# Patient Record
Sex: Female | Born: 1940 | Race: Black or African American | Hispanic: No | Smoking: Never smoker
Health system: Southern US, Community
[De-identification: ages and names within clinical notes are randomized; demographics above are authoritative.]

## PROBLEM LIST (undated history)

## (undated) DIAGNOSIS — C50919 Malignant neoplasm of unspecified site of unspecified female breast: Secondary | ICD-10-CM

## (undated) DIAGNOSIS — Z923 Personal history of irradiation: Secondary | ICD-10-CM

---

## 1997-10-26 ENCOUNTER — Other Ambulatory Visit: Admission: RE | Admit: 1997-10-26 | Discharge: 1997-10-26 | Payer: Self-pay | Admitting: *Deleted

## 1997-12-28 ENCOUNTER — Encounter: Payer: Self-pay | Admitting: Internal Medicine

## 1997-12-28 ENCOUNTER — Emergency Department (HOSPITAL_COMMUNITY): Admission: EM | Admit: 1997-12-28 | Discharge: 1997-12-28 | Payer: Self-pay | Admitting: Emergency Medicine

## 1998-02-06 ENCOUNTER — Emergency Department (HOSPITAL_COMMUNITY): Admission: EM | Admit: 1998-02-06 | Discharge: 1998-02-07 | Payer: Self-pay | Admitting: Emergency Medicine

## 1998-02-10 ENCOUNTER — Ambulatory Visit (HOSPITAL_COMMUNITY): Admission: RE | Admit: 1998-02-10 | Discharge: 1998-02-10 | Payer: Self-pay | Admitting: Internal Medicine

## 1998-02-10 ENCOUNTER — Encounter: Payer: Self-pay | Admitting: Internal Medicine

## 1998-11-29 ENCOUNTER — Other Ambulatory Visit: Admission: RE | Admit: 1998-11-29 | Discharge: 1998-11-29 | Payer: Self-pay | Admitting: *Deleted

## 1999-12-12 ENCOUNTER — Other Ambulatory Visit: Admission: RE | Admit: 1999-12-12 | Discharge: 1999-12-12 | Payer: Self-pay | Admitting: *Deleted

## 1999-12-13 ENCOUNTER — Ambulatory Visit (HOSPITAL_COMMUNITY): Admission: RE | Admit: 1999-12-13 | Discharge: 1999-12-13 | Payer: Self-pay | Admitting: *Deleted

## 1999-12-13 ENCOUNTER — Encounter: Payer: Self-pay | Admitting: *Deleted

## 2000-01-09 ENCOUNTER — Encounter: Admission: RE | Admit: 2000-01-09 | Discharge: 2000-01-09 | Payer: Self-pay | Admitting: *Deleted

## 2000-01-16 ENCOUNTER — Other Ambulatory Visit: Admission: RE | Admit: 2000-01-16 | Discharge: 2000-01-16 | Payer: Self-pay | Admitting: *Deleted

## 2000-01-16 ENCOUNTER — Encounter (INDEPENDENT_AMBULATORY_CARE_PROVIDER_SITE_OTHER): Payer: Self-pay | Admitting: *Deleted

## 2000-01-16 ENCOUNTER — Encounter: Payer: Self-pay | Admitting: *Deleted

## 2000-01-16 ENCOUNTER — Encounter: Admission: RE | Admit: 2000-01-16 | Discharge: 2000-01-16 | Payer: Self-pay | Admitting: *Deleted

## 2000-01-18 ENCOUNTER — Encounter: Admission: RE | Admit: 2000-01-18 | Discharge: 2000-04-17 | Payer: Self-pay | Admitting: Radiation Oncology

## 2000-02-02 DIAGNOSIS — C50919 Malignant neoplasm of unspecified site of unspecified female breast: Secondary | ICD-10-CM

## 2000-02-02 HISTORY — PX: BREAST LUMPECTOMY: SHX2

## 2000-02-02 HISTORY — DX: Malignant neoplasm of unspecified site of unspecified female breast: C50.919

## 2000-02-15 ENCOUNTER — Encounter (INDEPENDENT_AMBULATORY_CARE_PROVIDER_SITE_OTHER): Payer: Self-pay | Admitting: *Deleted

## 2000-02-15 ENCOUNTER — Encounter: Payer: Self-pay | Admitting: Surgery

## 2000-02-15 ENCOUNTER — Ambulatory Visit (HOSPITAL_BASED_OUTPATIENT_CLINIC_OR_DEPARTMENT_OTHER): Admission: RE | Admit: 2000-02-15 | Discharge: 2000-02-15 | Payer: Self-pay | Admitting: Surgery

## 2000-03-07 ENCOUNTER — Encounter (INDEPENDENT_AMBULATORY_CARE_PROVIDER_SITE_OTHER): Payer: Self-pay | Admitting: Specialist

## 2000-03-07 ENCOUNTER — Encounter: Payer: Self-pay | Admitting: Surgery

## 2000-03-07 ENCOUNTER — Ambulatory Visit (HOSPITAL_COMMUNITY): Admission: RE | Admit: 2000-03-07 | Discharge: 2000-03-07 | Payer: Self-pay | Admitting: Surgery

## 2000-04-18 ENCOUNTER — Encounter: Admission: RE | Admit: 2000-04-18 | Discharge: 2000-07-17 | Payer: Self-pay | Admitting: Radiation Oncology

## 2000-09-13 ENCOUNTER — Encounter: Payer: Self-pay | Admitting: Oncology

## 2000-09-13 ENCOUNTER — Encounter: Admission: RE | Admit: 2000-09-13 | Discharge: 2000-09-13 | Payer: Self-pay | Admitting: Oncology

## 2000-09-17 ENCOUNTER — Encounter: Admission: RE | Admit: 2000-09-17 | Discharge: 2000-10-08 | Payer: Self-pay | Admitting: Oncology

## 2000-12-17 ENCOUNTER — Encounter: Payer: Self-pay | Admitting: Oncology

## 2000-12-17 ENCOUNTER — Ambulatory Visit (HOSPITAL_COMMUNITY): Admission: RE | Admit: 2000-12-17 | Discharge: 2000-12-17 | Payer: Self-pay | Admitting: Oncology

## 2002-03-20 ENCOUNTER — Encounter: Payer: Self-pay | Admitting: Oncology

## 2002-03-20 ENCOUNTER — Encounter: Admission: RE | Admit: 2002-03-20 | Discharge: 2002-03-20 | Payer: Self-pay | Admitting: Oncology

## 2002-04-24 ENCOUNTER — Other Ambulatory Visit: Admission: RE | Admit: 2002-04-24 | Discharge: 2002-04-24 | Payer: Self-pay | Admitting: Obstetrics and Gynecology

## 2003-03-22 ENCOUNTER — Encounter: Admission: RE | Admit: 2003-03-22 | Discharge: 2003-03-22 | Payer: Self-pay | Admitting: Oncology

## 2003-08-13 ENCOUNTER — Ambulatory Visit (HOSPITAL_COMMUNITY): Admission: RE | Admit: 2003-08-13 | Discharge: 2003-08-13 | Payer: Self-pay | Admitting: Gastroenterology

## 2004-02-08 ENCOUNTER — Encounter: Admission: RE | Admit: 2004-02-08 | Discharge: 2004-02-08 | Payer: Self-pay | Admitting: Internal Medicine

## 2004-03-22 ENCOUNTER — Encounter: Admission: RE | Admit: 2004-03-22 | Discharge: 2004-03-22 | Payer: Self-pay | Admitting: Oncology

## 2004-03-28 ENCOUNTER — Ambulatory Visit: Payer: Self-pay | Admitting: Oncology

## 2004-10-03 ENCOUNTER — Ambulatory Visit: Payer: Self-pay | Admitting: Oncology

## 2004-12-05 ENCOUNTER — Encounter: Admission: RE | Admit: 2004-12-05 | Discharge: 2004-12-05 | Payer: Self-pay | Admitting: Internal Medicine

## 2004-12-05 ENCOUNTER — Ambulatory Visit: Payer: Self-pay | Admitting: Oncology

## 2005-03-28 ENCOUNTER — Encounter: Admission: RE | Admit: 2005-03-28 | Discharge: 2005-03-28 | Payer: Self-pay | Admitting: Oncology

## 2005-04-17 ENCOUNTER — Ambulatory Visit: Payer: Self-pay | Admitting: Oncology

## 2005-04-18 LAB — CBC WITH DIFFERENTIAL/PLATELET
Basophils Absolute: 0 10*3/uL (ref 0.0–0.1)
Eosinophils Absolute: 0.2 10*3/uL (ref 0.0–0.5)
HGB: 12.4 g/dL (ref 11.6–15.9)
MONO#: 0.5 10*3/uL (ref 0.1–0.9)
NEUT#: 4.5 10*3/uL (ref 1.5–6.5)
RBC: 4.15 10*6/uL (ref 3.70–5.32)
RDW: 12.9 % (ref 11.3–14.5)
WBC: 7 10*3/uL (ref 3.9–10.0)

## 2005-04-18 LAB — COMPREHENSIVE METABOLIC PANEL
Albumin: 4 g/dL (ref 3.5–5.2)
CO2: 28 mEq/L (ref 19–32)
Calcium: 8.8 mg/dL (ref 8.4–10.5)
Chloride: 104 mEq/L (ref 96–112)
Glucose, Bld: 102 mg/dL — ABNORMAL HIGH (ref 70–99)
Potassium: 3.8 mEq/L (ref 3.5–5.3)
Sodium: 142 mEq/L (ref 135–145)
Total Protein: 7.7 g/dL (ref 6.0–8.3)

## 2005-06-21 ENCOUNTER — Ambulatory Visit: Payer: Self-pay | Admitting: Oncology

## 2006-03-26 ENCOUNTER — Emergency Department (HOSPITAL_COMMUNITY): Admission: EM | Admit: 2006-03-26 | Discharge: 2006-03-26 | Payer: Self-pay | Admitting: *Deleted

## 2007-10-07 ENCOUNTER — Encounter: Admission: RE | Admit: 2007-10-07 | Discharge: 2007-10-07 | Payer: Self-pay | Admitting: Oncology

## 2007-11-19 ENCOUNTER — Ambulatory Visit: Payer: Self-pay | Admitting: Oncology

## 2007-11-21 LAB — CBC WITH DIFFERENTIAL/PLATELET
Basophils Absolute: 0 10*3/uL (ref 0.0–0.1)
Eosinophils Absolute: 0.1 10*3/uL (ref 0.0–0.5)
HCT: 38 % (ref 34.8–46.6)
HGB: 13.1 g/dL (ref 11.6–15.9)
LYMPH%: 20.9 % (ref 14.0–48.0)
MCV: 87 fL (ref 81.0–101.0)
MONO#: 0.6 10*3/uL (ref 0.1–0.9)
MONO%: 8.4 % (ref 0.0–13.0)
NEUT#: 5.3 10*3/uL (ref 1.5–6.5)
Platelets: 262 10*3/uL (ref 145–400)
RBC: 4.36 10*6/uL (ref 3.70–5.32)
WBC: 7.8 10*3/uL (ref 3.9–10.0)

## 2007-11-21 LAB — COMPREHENSIVE METABOLIC PANEL
Albumin: 4.1 g/dL (ref 3.5–5.2)
BUN: 16 mg/dL (ref 6–23)
CO2: 26 mEq/L (ref 19–32)
Glucose, Bld: 108 mg/dL — ABNORMAL HIGH (ref 70–99)
Sodium: 141 mEq/L (ref 135–145)
Total Bilirubin: 0.2 mg/dL — ABNORMAL LOW (ref 0.3–1.2)
Total Protein: 7.8 g/dL (ref 6.0–8.3)

## 2008-11-17 ENCOUNTER — Ambulatory Visit: Payer: Self-pay | Admitting: Oncology

## 2009-01-14 ENCOUNTER — Encounter: Admission: RE | Admit: 2009-01-14 | Discharge: 2009-01-14 | Payer: Self-pay | Admitting: Oncology

## 2010-01-22 ENCOUNTER — Encounter: Payer: Self-pay | Admitting: Oncology

## 2010-01-22 ENCOUNTER — Encounter: Payer: Self-pay | Admitting: Internal Medicine

## 2010-05-19 NOTE — Op Note (Signed)
NAME:  Whitney Miller, Whitney Miller                     ACCOUNT NO.:  [id number]   MEDICAL RECORD NO.:  [id number]                   PATIENT TYPE:  AMB   LOCATION:  ENDO                                 FACILITY:  [facility]   PHYSICIAN:  Anselmo Rod, M.D.               DATE OF BIRTH:  Jun 08, 1940   DATE OF PROCEDURE:  08/26/2003  DATE OF DISCHARGE:  08/13/2003                                 OPERATIVE REPORT   PROCEDURE PERFORMED:  Colonoscopy up to the hepatic flexure.   ENDOSCOPIST:  Charna Elizabeth, M.D.   INSTRUMENT USED:  Olympus video colonoscope.   INDICATIONS FOR PROCEDURE:  The patient is a 70 year old female with  personal history of breast cancer in the past undergoing screening  colonoscopy to rule out colonic polyps, masses, etc.   PREPROCEDURE PREPARATION:  Informed consent was procured from the patient.  The patient was fasted for eight hours prior to the procedure and prepped  with a bottle of magnesium citrate and a gallon of GoLYTELY the night prior  to the procedure.   PREPROCEDURE PHYSICAL:  The patient had stable vital signs.  Neck supple.  Chest clear to auscultation.  S1 and S2 regular.  Abdomen soft with normal  bowel sounds.   DESCRIPTION OF PROCEDURE:  The patient was placed in left lateral decubitus  position and sedated with 60 mg of Demerol and 7.5 mg of Versed in slow  incremental doses.  Once the patient was adequately sedated and maintained  on low flow oxygen and continuous cardiac monitoring, the Olympus video  colonoscope was advanced from the rectum to the hepatic flexure with extreme  difficulty.  The patient had a very redundant colon.  The scope was  withdrawn several times and the patient repositioned from the left lateral  to the supine and the right lateral position with gentle application of  abdominal pressure.  A small patch of erythema was noticed in the midleft  colon.  This did not see to be typical of an arteriovenous malformation.  No  biopsies were done.  No masses or polyps were identified.  There was no  evidence of diverticulosis.  The cecum and the right colon could not be  visualized because of a poor prep and a very redundant colon.  The procedure  was aborted at the hepatic flexure with plans for a barium enema in the  future.   IMPRESSION:  1. Incomplete colonoscopy up to the distal right colon.  Very redundant     colon with some residual stool in the colon.  2. Small patch of erythema in the midleft colon, not typical of an     arteriovenous malformation, no biopsies done.   RECOMMENDATIONS:  Air contrast barium enema will be planned and further  recommendations made in follow-up.  Anselmo Rod, M.D.    JNM/MEDQ  D:  08/26/2003  T:  08/26/2003  Job:  [id number]   cc:   Larina Earthly, M.D.  [address]  [place]  [place] [address]  Fax: [phone]   Sandria Bales. Ezzard Standing, M.D.  [address], [address]  [place]  [place] [address]  Fax: [phone]   Lennis P. Darrold Span, M.D.  [address] [facility]  [place]  [place] [address]  Fax: [phone]

## 2010-05-26 ENCOUNTER — Other Ambulatory Visit: Payer: Self-pay | Admitting: Oncology

## 2010-05-26 DIAGNOSIS — Z1231 Encounter for screening mammogram for malignant neoplasm of breast: Secondary | ICD-10-CM

## 2010-05-30 ENCOUNTER — Ambulatory Visit
Admission: RE | Admit: 2010-05-30 | Discharge: 2010-05-30 | Disposition: A | Discharge status: Home / Self Care | Payer: Medicare Other | Source: Ambulatory Visit | Attending: Oncology | Admitting: Oncology

## 2010-05-30 DIAGNOSIS — Z1231 Encounter for screening mammogram for malignant neoplasm of breast: Secondary | ICD-10-CM

## 2011-02-16 DIAGNOSIS — I10 Essential (primary) hypertension: Secondary | ICD-10-CM | POA: Diagnosis not present

## 2011-02-16 DIAGNOSIS — R82998 Other abnormal findings in urine: Secondary | ICD-10-CM | POA: Diagnosis not present

## 2011-02-16 DIAGNOSIS — E785 Hyperlipidemia, unspecified: Secondary | ICD-10-CM | POA: Diagnosis not present

## 2011-02-19 DIAGNOSIS — Z0000 Encounter for general adult medical examination without abnormal findings: Secondary | ICD-10-CM | POA: Diagnosis not present

## 2011-02-19 DIAGNOSIS — I10 Essential (primary) hypertension: Secondary | ICD-10-CM | POA: Diagnosis not present

## 2011-02-19 DIAGNOSIS — M79609 Pain in unspecified limb: Secondary | ICD-10-CM | POA: Diagnosis not present

## 2011-02-19 DIAGNOSIS — R82998 Other abnormal findings in urine: Secondary | ICD-10-CM | POA: Diagnosis not present

## 2011-02-21 DIAGNOSIS — Z1212 Encounter for screening for malignant neoplasm of rectum: Secondary | ICD-10-CM | POA: Diagnosis not present

## 2011-03-14 ENCOUNTER — Other Ambulatory Visit: Payer: Self-pay | Admitting: Gastroenterology

## 2011-03-14 DIAGNOSIS — Z1211 Encounter for screening for malignant neoplasm of colon: Secondary | ICD-10-CM

## 2011-03-14 DIAGNOSIS — Q438 Other specified congenital malformations of intestine: Secondary | ICD-10-CM

## 2012-07-17 ENCOUNTER — Other Ambulatory Visit: Payer: Self-pay

## 2012-07-17 DIAGNOSIS — Z853 Personal history of malignant neoplasm of breast: Secondary | ICD-10-CM

## 2012-07-17 DIAGNOSIS — Z1231 Encounter for screening mammogram for malignant neoplasm of breast: Secondary | ICD-10-CM

## 2012-07-17 DIAGNOSIS — Z9889 Other specified postprocedural states: Secondary | ICD-10-CM

## 2012-08-07 ENCOUNTER — Ambulatory Visit
Admission: RE | Admit: 2012-08-07 | Discharge: 2012-08-07 | Disposition: A | Discharge status: Home / Self Care | Payer: Medicare Other | Source: Ambulatory Visit

## 2012-08-07 DIAGNOSIS — Z9889 Other specified postprocedural states: Secondary | ICD-10-CM

## 2012-08-07 DIAGNOSIS — Z1231 Encounter for screening mammogram for malignant neoplasm of breast: Secondary | ICD-10-CM | POA: Diagnosis not present

## 2012-08-07 DIAGNOSIS — Z853 Personal history of malignant neoplasm of breast: Secondary | ICD-10-CM

## 2012-10-03 DIAGNOSIS — C50919 Malignant neoplasm of unspecified site of unspecified female breast: Secondary | ICD-10-CM | POA: Diagnosis not present

## 2012-10-03 DIAGNOSIS — I10 Essential (primary) hypertension: Secondary | ICD-10-CM | POA: Diagnosis not present

## 2012-10-03 DIAGNOSIS — Z6829 Body mass index (BMI) 29.0-29.9, adult: Secondary | ICD-10-CM | POA: Diagnosis not present

## 2012-10-03 DIAGNOSIS — F39 Unspecified mood [affective] disorder: Secondary | ICD-10-CM | POA: Diagnosis not present

## 2012-10-03 DIAGNOSIS — E785 Hyperlipidemia, unspecified: Secondary | ICD-10-CM | POA: Diagnosis not present

## 2012-10-03 DIAGNOSIS — M79609 Pain in unspecified limb: Secondary | ICD-10-CM | POA: Diagnosis not present

## 2013-09-30 ENCOUNTER — Other Ambulatory Visit: Payer: Self-pay

## 2013-09-30 DIAGNOSIS — Z1231 Encounter for screening mammogram for malignant neoplasm of breast: Secondary | ICD-10-CM

## 2013-10-16 ENCOUNTER — Ambulatory Visit
Admission: RE | Admit: 2013-10-16 | Discharge: 2013-10-16 | Disposition: A | Discharge status: Home / Self Care | Payer: Medicare Other | Source: Ambulatory Visit

## 2013-10-16 DIAGNOSIS — Z1231 Encounter for screening mammogram for malignant neoplasm of breast: Secondary | ICD-10-CM | POA: Diagnosis not present

## 2013-11-03 DIAGNOSIS — Z23 Encounter for immunization: Secondary | ICD-10-CM | POA: Diagnosis not present

## 2013-11-03 DIAGNOSIS — J302 Other seasonal allergic rhinitis: Secondary | ICD-10-CM | POA: Diagnosis not present

## 2013-11-03 DIAGNOSIS — Z1389 Encounter for screening for other disorder: Secondary | ICD-10-CM | POA: Diagnosis not present

## 2013-11-03 DIAGNOSIS — K219 Gastro-esophageal reflux disease without esophagitis: Secondary | ICD-10-CM | POA: Diagnosis not present

## 2013-11-03 DIAGNOSIS — I10 Essential (primary) hypertension: Secondary | ICD-10-CM | POA: Diagnosis not present

## 2013-11-03 DIAGNOSIS — F39 Unspecified mood [affective] disorder: Secondary | ICD-10-CM | POA: Diagnosis not present

## 2013-11-03 DIAGNOSIS — E785 Hyperlipidemia, unspecified: Secondary | ICD-10-CM | POA: Diagnosis not present

## 2013-11-03 DIAGNOSIS — C50919 Malignant neoplasm of unspecified site of unspecified female breast: Secondary | ICD-10-CM | POA: Diagnosis not present

## 2014-01-04 DIAGNOSIS — E785 Hyperlipidemia, unspecified: Secondary | ICD-10-CM | POA: Diagnosis not present

## 2014-01-04 DIAGNOSIS — F39 Unspecified mood [affective] disorder: Secondary | ICD-10-CM | POA: Diagnosis not present

## 2014-01-04 DIAGNOSIS — Z6830 Body mass index (BMI) 30.0-30.9, adult: Secondary | ICD-10-CM | POA: Diagnosis not present

## 2014-01-04 DIAGNOSIS — I10 Essential (primary) hypertension: Secondary | ICD-10-CM | POA: Diagnosis not present

## 2014-01-28 DIAGNOSIS — I10 Essential (primary) hypertension: Secondary | ICD-10-CM | POA: Diagnosis not present

## 2014-01-28 DIAGNOSIS — F39 Unspecified mood [affective] disorder: Secondary | ICD-10-CM | POA: Diagnosis not present

## 2014-03-12 DIAGNOSIS — Z6830 Body mass index (BMI) 30.0-30.9, adult: Secondary | ICD-10-CM | POA: Diagnosis not present

## 2014-03-12 DIAGNOSIS — F39 Unspecified mood [affective] disorder: Secondary | ICD-10-CM | POA: Diagnosis not present

## 2014-03-12 DIAGNOSIS — I10 Essential (primary) hypertension: Secondary | ICD-10-CM | POA: Diagnosis not present

## 2014-03-22 DIAGNOSIS — I10 Essential (primary) hypertension: Secondary | ICD-10-CM | POA: Diagnosis not present

## 2014-03-22 DIAGNOSIS — J302 Other seasonal allergic rhinitis: Secondary | ICD-10-CM | POA: Diagnosis not present

## 2014-03-22 DIAGNOSIS — Z6830 Body mass index (BMI) 30.0-30.9, adult: Secondary | ICD-10-CM | POA: Diagnosis not present

## 2014-03-22 DIAGNOSIS — R040 Epistaxis: Secondary | ICD-10-CM | POA: Diagnosis not present

## 2015-02-02 ENCOUNTER — Other Ambulatory Visit: Payer: Self-pay

## 2015-02-02 DIAGNOSIS — Z1231 Encounter for screening mammogram for malignant neoplasm of breast: Secondary | ICD-10-CM

## 2015-02-23 ENCOUNTER — Ambulatory Visit
Admission: RE | Admit: 2015-02-23 | Discharge: 2015-02-23 | Disposition: A | Discharge status: Home / Self Care | Payer: Medicare Other | Source: Ambulatory Visit

## 2015-02-23 DIAGNOSIS — Z1231 Encounter for screening mammogram for malignant neoplasm of breast: Secondary | ICD-10-CM | POA: Diagnosis not present

## 2015-03-23 DIAGNOSIS — E785 Hyperlipidemia, unspecified: Secondary | ICD-10-CM | POA: Diagnosis not present

## 2015-03-23 DIAGNOSIS — I10 Essential (primary) hypertension: Secondary | ICD-10-CM | POA: Diagnosis not present

## 2015-03-23 DIAGNOSIS — J309 Allergic rhinitis, unspecified: Secondary | ICD-10-CM | POA: Diagnosis not present

## 2015-03-23 DIAGNOSIS — Z79899 Other long term (current) drug therapy: Secondary | ICD-10-CM | POA: Diagnosis not present

## 2015-03-23 DIAGNOSIS — E2839 Other primary ovarian failure: Secondary | ICD-10-CM | POA: Diagnosis not present

## 2015-03-23 DIAGNOSIS — F418 Other specified anxiety disorders: Secondary | ICD-10-CM | POA: Diagnosis not present

## 2015-05-10 DIAGNOSIS — Z780 Asymptomatic menopausal state: Secondary | ICD-10-CM | POA: Diagnosis not present

## 2015-09-23 DIAGNOSIS — R944 Abnormal results of kidney function studies: Secondary | ICD-10-CM | POA: Diagnosis not present

## 2015-09-23 DIAGNOSIS — I10 Essential (primary) hypertension: Secondary | ICD-10-CM | POA: Diagnosis not present

## 2015-09-23 DIAGNOSIS — R202 Paresthesia of skin: Secondary | ICD-10-CM | POA: Diagnosis not present

## 2015-09-23 DIAGNOSIS — E785 Hyperlipidemia, unspecified: Secondary | ICD-10-CM | POA: Diagnosis not present

## 2015-09-23 DIAGNOSIS — F418 Other specified anxiety disorders: Secondary | ICD-10-CM | POA: Diagnosis not present

## 2016-08-07 ENCOUNTER — Other Ambulatory Visit: Payer: Self-pay | Admitting: Family

## 2016-08-07 DIAGNOSIS — Z1231 Encounter for screening mammogram for malignant neoplasm of breast: Secondary | ICD-10-CM

## 2016-08-21 ENCOUNTER — Ambulatory Visit
Admission: RE | Admit: 2016-08-21 | Discharge: 2016-08-21 | Disposition: A | Discharge status: Home / Self Care | Payer: Medicare Other | Source: Ambulatory Visit | Attending: Family | Admitting: Family

## 2016-08-21 ENCOUNTER — Other Ambulatory Visit: Payer: Self-pay | Admitting: Nurse Practitioner

## 2016-08-21 DIAGNOSIS — Z1231 Encounter for screening mammogram for malignant neoplasm of breast: Secondary | ICD-10-CM

## 2016-08-21 HISTORY — DX: Personal history of irradiation: Z92.3

## 2016-08-21 HISTORY — DX: Malignant neoplasm of unspecified site of unspecified female breast: C50.919

## 2016-12-06 ENCOUNTER — Other Ambulatory Visit (HOSPITAL_COMMUNITY)
Admission: RE | Admit: 2016-12-06 | Discharge: 2016-12-06 | Disposition: A | Discharge status: Home / Self Care | Payer: Medicare Other | Source: Ambulatory Visit | Attending: Nurse Practitioner | Admitting: Nurse Practitioner

## 2016-12-06 ENCOUNTER — Other Ambulatory Visit: Payer: Self-pay | Admitting: Nurse Practitioner

## 2016-12-06 DIAGNOSIS — Z124 Encounter for screening for malignant neoplasm of cervix: Secondary | ICD-10-CM | POA: Insufficient documentation

## 2016-12-13 LAB — CYTOLOGY - PAP
DIAGNOSIS: NEGATIVE
HPV: NOT DETECTED

## 2016-12-14 ENCOUNTER — Ambulatory Visit: Payer: Medicare Other | Admitting: Podiatry

## 2016-12-14 ENCOUNTER — Other Ambulatory Visit: Payer: Self-pay | Admitting: Podiatry

## 2016-12-14 ENCOUNTER — Encounter: Payer: Self-pay | Admitting: Podiatry

## 2016-12-14 ENCOUNTER — Ambulatory Visit (INDEPENDENT_AMBULATORY_CARE_PROVIDER_SITE_OTHER): Payer: Medicare Other

## 2016-12-14 VITALS — BP 134/75 | HR 51 | Resp 16

## 2016-12-14 DIAGNOSIS — M779 Enthesopathy, unspecified: Secondary | ICD-10-CM

## 2016-12-14 DIAGNOSIS — B351 Tinea unguium: Secondary | ICD-10-CM | POA: Diagnosis not present

## 2016-12-14 DIAGNOSIS — M79672 Pain in left foot: Secondary | ICD-10-CM | POA: Diagnosis not present

## 2016-12-14 DIAGNOSIS — M79674 Pain in right toe(s): Secondary | ICD-10-CM | POA: Diagnosis not present

## 2016-12-14 DIAGNOSIS — M79675 Pain in left toe(s): Secondary | ICD-10-CM

## 2016-12-14 MED ORDER — TRIAMCINOLONE ACETONIDE 10 MG/ML IJ SUSP
10.0000 mg | Freq: Once | INTRAMUSCULAR | Status: AC
  Administered 2016-12-14: 10 mg

## 2016-12-14 NOTE — Progress Notes (Signed)
   Subjective:    Patient ID: Whitney Miller, female    DOB: 07/02/1940, 76 y.o.   MRN: [id number]  HPI    Review of Systems  All other systems reviewed and are negative.      Objective:   Physical Exam        Assessment & Plan:

## 2016-12-15 NOTE — Progress Notes (Signed)
Subjective:   Patient ID: Whitney Miller, female   DOB: 76 y.o.   MRN: [id number]   HPI Patient presents with a lot of pain in the outside of the left foot of a number of months duration and also has thick yellow brittle nailbeds 1-5 both feet that they cannot cut they are becoming increasingly painful in shoe gear   Review of Systems  All other systems reviewed and are negative.       Objective:  Physical Exam  Constitutional: She appears well-developed and well-nourished.  Cardiovascular: Intact distal pulses.  Pulmonary/Chest: Effort normal.  Musculoskeletal: Normal range of motion.  Neurological: She is alert.  Skin: Skin is warm.  Nursing note and vitals reviewed.   Neurovascular status intact muscle strength adequate range of motion was within normal limits with patient found to have quite a bit of inflammation pain at the peroneal insertion into the base of the fifth metatarsal left.  Patient has fluid buildup noted at the insertion but no indication of tendon damage was also noted to have thick yellow brittle nailbeds 1-5 both feet that are painful.  Patient does not smoke and would like to be more active and does have good digital perfusion     Assessment:  Tendinitis of the left lateral side perineal base with no indication of muscle dysfunction along with mycotic nail infection 1-5 both feet     Plan:  H&P condition and x-rays reviewed with patient.  At this point careful sheath injection administered left at the insertion base of fifth metatarsal 3 mg Kenalog 5 mg Xylocaine and 1 had applied fascial brace lifting the lateral side of the foot up and instructed on ice and debrided nailbeds 1-5 both feet with no iatrogenic bleeding noted.  Reappoint to recheck  X-rays indicated no indication of stress fracture or fracture with slight reactivity around the base of the fifth metatarsal left

## 2017-03-13 ENCOUNTER — Ambulatory Visit: Payer: Medicare Other | Admitting: Podiatry

## 2017-06-07 ENCOUNTER — Ambulatory Visit
Admission: RE | Admit: 2017-06-07 | Discharge: 2017-06-07 | Disposition: A | Discharge status: Home / Self Care | Payer: Medicare Other | Source: Ambulatory Visit | Attending: Nurse Practitioner | Admitting: Nurse Practitioner

## 2017-06-07 ENCOUNTER — Other Ambulatory Visit: Payer: Self-pay | Admitting: Nurse Practitioner

## 2017-06-07 DIAGNOSIS — M4135 Thoracogenic scoliosis, thoracolumbar region: Secondary | ICD-10-CM

## 2017-07-02 ENCOUNTER — Other Ambulatory Visit: Payer: Self-pay

## 2017-07-02 ENCOUNTER — Ambulatory Visit: Payer: Medicare Other | Attending: Nurse Practitioner

## 2017-07-02 DIAGNOSIS — R293 Abnormal posture: Secondary | ICD-10-CM | POA: Diagnosis present

## 2017-07-02 DIAGNOSIS — M62830 Muscle spasm of back: Secondary | ICD-10-CM | POA: Insufficient documentation

## 2017-07-02 DIAGNOSIS — R2681 Unsteadiness on feet: Secondary | ICD-10-CM | POA: Diagnosis present

## 2017-07-02 DIAGNOSIS — G8929 Other chronic pain: Secondary | ICD-10-CM | POA: Insufficient documentation

## 2017-07-02 DIAGNOSIS — M4126 Other idiopathic scoliosis, lumbar region: Secondary | ICD-10-CM

## 2017-07-02 DIAGNOSIS — M545 Low back pain: Secondary | ICD-10-CM | POA: Diagnosis not present
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
## 2017-07-16 ENCOUNTER — Ambulatory Visit: Payer: Medicare Other

## 2017-07-30 ENCOUNTER — Encounter: Payer: Medicare Other | Admitting: Physical Therapy

## 2017-08-05 ENCOUNTER — Encounter: Payer: Medicare Other | Admitting: Physical Therapy

## 2018-01-17 ENCOUNTER — Other Ambulatory Visit: Payer: Self-pay | Admitting: Nurse Practitioner

## 2018-01-17 DIAGNOSIS — Z1231 Encounter for screening mammogram for malignant neoplasm of breast: Secondary | ICD-10-CM

## 2018-02-10 ENCOUNTER — Other Ambulatory Visit: Payer: Self-pay | Admitting: Nurse Practitioner

## 2018-02-10 DIAGNOSIS — Z13820 Encounter for screening for osteoporosis: Secondary | ICD-10-CM

## 2018-04-09 ENCOUNTER — Ambulatory Visit: Payer: Medicare Other

## 2018-04-09 ENCOUNTER — Other Ambulatory Visit: Payer: Medicare Other

## 2018-06-11 ENCOUNTER — Ambulatory Visit
Admission: RE | Admit: 2018-06-11 | Discharge: 2018-06-11 | Disposition: A | Discharge status: Home / Self Care | Payer: Medicare Other | Source: Ambulatory Visit | Attending: Nurse Practitioner | Admitting: Nurse Practitioner

## 2018-06-11 ENCOUNTER — Other Ambulatory Visit: Payer: Self-pay

## 2018-06-11 DIAGNOSIS — Z13820 Encounter for screening for osteoporosis: Secondary | ICD-10-CM

## 2018-06-11 DIAGNOSIS — Z1231 Encounter for screening mammogram for malignant neoplasm of breast: Secondary | ICD-10-CM

## 2018-06-12 ENCOUNTER — Other Ambulatory Visit: Payer: Self-pay | Admitting: *Deleted

## 2018-06-12 DIAGNOSIS — R928 Other abnormal and inconclusive findings on diagnostic imaging of breast: Secondary | ICD-10-CM

## 2018-06-13 ENCOUNTER — Other Ambulatory Visit: Payer: Self-pay | Admitting: Internal Medicine

## 2018-06-13 DIAGNOSIS — R928 Other abnormal and inconclusive findings on diagnostic imaging of breast: Secondary | ICD-10-CM

## 2018-06-17 ENCOUNTER — Other Ambulatory Visit: Payer: Self-pay | Admitting: Internal Medicine

## 2018-06-17 ENCOUNTER — Ambulatory Visit
Admission: RE | Admit: 2018-06-17 | Discharge: 2018-06-17 | Disposition: A | Discharge status: Home / Self Care | Payer: Medicare Other | Source: Ambulatory Visit | Attending: Internal Medicine | Admitting: Internal Medicine

## 2018-06-17 DIAGNOSIS — M25462 Effusion, left knee: Secondary | ICD-10-CM

## 2018-06-19 ENCOUNTER — Ambulatory Visit
Admission: RE | Admit: 2018-06-19 | Discharge: 2018-06-19 | Disposition: A | Discharge status: Home / Self Care | Payer: Medicare Other | Source: Ambulatory Visit | Attending: Internal Medicine | Admitting: Internal Medicine

## 2018-06-19 ENCOUNTER — Other Ambulatory Visit: Payer: Self-pay | Admitting: Internal Medicine

## 2018-06-19 ENCOUNTER — Other Ambulatory Visit: Payer: Self-pay

## 2018-06-19 DIAGNOSIS — R599 Enlarged lymph nodes, unspecified: Secondary | ICD-10-CM

## 2018-06-19 DIAGNOSIS — R928 Other abnormal and inconclusive findings on diagnostic imaging of breast: Secondary | ICD-10-CM

## 2018-07-07 ENCOUNTER — Other Ambulatory Visit: Payer: Self-pay | Admitting: Internal Medicine

## 2018-12-22 ENCOUNTER — Ambulatory Visit
Admission: RE | Admit: 2018-12-22 | Discharge: 2018-12-22 | Disposition: A | Discharge status: Home / Self Care | Payer: Medicare Other | Source: Ambulatory Visit | Attending: Internal Medicine | Admitting: Internal Medicine

## 2018-12-22 ENCOUNTER — Other Ambulatory Visit: Payer: Self-pay

## 2018-12-22 ENCOUNTER — Other Ambulatory Visit: Payer: Self-pay | Admitting: Internal Medicine

## 2018-12-22 DIAGNOSIS — R599 Enlarged lymph nodes, unspecified: Secondary | ICD-10-CM

## 2019-03-08 ENCOUNTER — Ambulatory Visit: Payer: Medicare Other | Attending: Internal Medicine

## 2019-03-08 ENCOUNTER — Ambulatory Visit: Payer: Medicare Other

## 2019-03-08 DIAGNOSIS — Z23 Encounter for immunization: Secondary | ICD-10-CM | POA: Insufficient documentation

## 2019-03-08 NOTE — Progress Notes (Signed)
   Covid-19 Vaccination Clinic  Name:  KARSIN MOLINERO    MRN: [id number] DOB: [birth year]  03/08/2019  Ms. Barina was observed post Covid-19 immunization for 15 minutes without incident. She was provided with Vaccine Information Sheet and instruction to access the V-Safe system.   Ms. Mayorquin was instructed to call 911 with any severe reactions post vaccine: Marland Kitchen Difficulty breathing  . Swelling of face and throat  . A fast heartbeat  . A bad rash all over body  . Dizziness and weakness   Immunizations Administered    Name Date Dose VIS Date Route   Pfizer COVID-19 Vaccine 03/08/2019  3:05 PM 0.3 mL 12/12/2018 Intramuscular   Manufacturer: [place]   Lot: [id number]   [place]: [id number]

## 2019-04-07 ENCOUNTER — Ambulatory Visit: Payer: Medicare Other | Attending: Internal Medicine

## 2019-04-07 DIAGNOSIS — Z23 Encounter for immunization: Secondary | ICD-10-CM

## 2019-04-07 NOTE — Progress Notes (Signed)
   Covid-19 Vaccination Clinic  Name:  BILL BONVILLE    MRN: [id number] DOB: 25-Oct-1940  04/07/2019  Ms. Cannella was observed post Covid-19 immunization for 15 minutes without incident. She was provided with Vaccine Information Sheet and instruction to access the V-Safe system.   Ms. Sheesley was instructed to call 911 with any severe reactions post vaccine: Marland Kitchen Difficulty breathing  . Swelling of face and throat  . A fast heartbeat  . A bad rash all over body  . Dizziness and weakness   Immunizations Administered    Name Date Dose VIS Date Route   Pfizer COVID-19 Vaccine 04/07/2019  1:38 PM 0.3 mL 12/12/2018 Intramuscular   Manufacturer: [employer]   Lot: [id number]   [place]: [id number]

## 2019-06-25 ENCOUNTER — Encounter: Payer: Self-pay | Admitting: Podiatry

## 2019-06-25 ENCOUNTER — Ambulatory Visit: Payer: Medicare Other | Admitting: Podiatry

## 2019-06-25 ENCOUNTER — Other Ambulatory Visit: Payer: Self-pay

## 2019-06-25 DIAGNOSIS — B351 Tinea unguium: Secondary | ICD-10-CM

## 2019-06-25 DIAGNOSIS — M79675 Pain in left toe(s): Secondary | ICD-10-CM | POA: Diagnosis not present

## 2019-06-25 DIAGNOSIS — M79674 Pain in right toe(s): Secondary | ICD-10-CM

## 2019-06-25 MED ORDER — NONFORMULARY OR COMPOUNDED ITEM: 3 refills | Status: DC

## 2019-06-25 NOTE — Patient Instructions (Addendum)
[facility] [phone] Antifungal nail solution  Onychomycosis/Fungal Toenails  WHAT IS IT? An infection that lies within the keratin of your nail plate that is caused by a fungus.  WHY ME? Fungal infections affect all ages, sexes, races, and creeds.  There may be many factors that predispose you to a fungal infection such as age, coexisting medical conditions such as diabetes, or an autoimmune disease; stress, medications, fatigue, genetics, etc.  Bottom line: fungus thrives in a warm, moist environment and your shoes offer such a location.  IS IT CONTAGIOUS? Theoretically, yes.  You do not want to share shoes, nail clippers or files with someone who has fungal toenails.  Walking around barefoot in the same room or sleeping in the same bed is unlikely to transfer the organism.  It is important to realize, however, that fungus can spread easily from one nail to the next on the same foot.  HOW DO WE TREAT THIS?  There are several ways to treat this condition.  Treatment may depend on many factors such as age, medications, pregnancy, liver and kidney conditions, etc.  It is best to ask your doctor which options are available to you.  1. No treatment.   Unlike many other medical concerns, you can live with this condition.  However for many people this can be a painful condition and may lead to ingrown toenails or a bacterial infection.  It is recommended that you keep the nails cut short to help reduce the amount of fungal nail. 2. Topical treatment.  These range from herbal remedies to prescription strength nail lacquers.  About 40-50% effective, topicals require twice daily application for approximately 9 to 12 months or until an entirely new nail has grown out.  The most effective topicals are medical grade medications available through physicians offices. 3. Oral antifungal medications.  With an 80-90% cure rate, the most common oral medication requires 3 to 4 months of therapy and stays  in your system for a year as the new nail grows out.  Oral antifungal medications do require blood work to make sure it is a safe drug for you.  A liver function panel will be performed prior to starting the medication and after the first month of treatment.  It is important to have the blood work performed to avoid any harmful side effects.  In general, this medication safe but blood work is required. 4. Laser Therapy.  This treatment is performed by applying a specialized laser to the affected nail plate.  This therapy is noninvasive, fast, and non-painful.  It is not covered by insurance and is therefore, out of pocket.  The results have been very good with a 80-95% cure rate.  The [facility] is the only practice in the area to offer this therapy. 5. Permanent Nail Avulsion.  Removing the entire nail so that a new nail will not grow back. 

## 2019-07-01 NOTE — Progress Notes (Signed)
Subjective: Whitney Miller is a 79 y.o. female patient seen today painful mycotic nails b/l that are difficult to trim. Pain interferes with ambulation. Aggravating factors include wearing enclosed shoe gear. Pain is relieved with periodic professional debridement.  Past Medical History:  Diagnosis Date  . Breast cancer ([place]) 02/2000   Rt  . Personal history of radiation therapy     There are no problems to display for this patient.   Current Outpatient Medications on File Prior to Visit  Medication Sig Dispense Refill  . citalopram (CELEXA) 20 MG tablet     . fluticasone (FLONASE) 50 MCG/ACT nasal spray Place 1 spray into both nostrils daily.    Marland Kitchen lisinopril-hydrochlorothiazide (PRINZIDE,ZESTORETIC) 20-12.5 MG tablet     . metoprolol tartrate (LOPRESSOR) 100 MG tablet     . simvastatin (ZOCOR) 40 MG tablet      No current facility-administered medications on file prior to visit.    Allergies  Allergen Reactions  . Penicillins Hives  . Tylenol [Acetaminophen] Hives    Objective: Physical Exam  General: Patient is a pleasant 79 y.o. African American female in NAD. AAO x 3.   Neurovascular Examination: Neurovascular status unchanged b/l lower extremities. Capillary refill time to digits immediate b/l. Palpable pedal pulses b/l LE. Pedal hair sparse. Lower extremity skin temperature gradient within normal limits. No pain with calf compression b/l.   Protective sensation intact 5/5 intact bilaterally with 10g monofilament b/l. Vibratory sensation intact b/l. Babinski reflex negative b/l. Clonus negative b/l.  Dermatological:  Pedal skin with normal turgor, texture and tone bilaterally. No open wounds bilaterally. No interdigital macerations bilaterally. Toenails 1-5 b/l elongated, discolored, dystrophic, thickened, crumbly with subungual debris and tenderness to dorsal palpation.  Musculoskeletal:  Normal muscle strength 5/5 to all lower extremity muscle groups  bilaterally. No pain crepitus or joint limitation noted with ROM b/l. Hallux valgus with bunion deformity noted b/l lower extremities. Pes cavus deformity noted b/l.  Patient ambulates independent of any assistive aids.  Assessment and Plan:  No diagnosis found. -Examined patient. -Discussed topical, laser and oral medication. Patient opted for topical treatment with compounded medication. Rx written for nonformulary compounding topical antifungal: [facility]: Antifungal cream - Terbinafine 3%, Fluconazole 2%, Tea Tree Oil 5%, Urea 10%, Ibuprofen 2% in DMSO Suspension #76ml. Apply to the affected nail(s) at bedtime. -Toenails 1-5 b/l were debrided in length and girth with sterile nail nippers and dremel without iatrogenic bleeding.  -Patient to report any pedal injuries to medical professional immediately. -Patient to continue soft, supportive shoe gear daily. -Patient/POA to call should there be question/concern in the interim.  Return in about 3 months (around 09/25/2019) for nail trim.  Marzetta Board, DPM

## 2019-10-02 ENCOUNTER — Ambulatory Visit: Payer: Medicare Other | Admitting: Podiatry

## 2019-11-06 ENCOUNTER — Ambulatory Visit
Admission: RE | Admit: 2019-11-06 | Discharge: 2019-11-06 | Disposition: A | Discharge status: Home / Self Care | Payer: Medicare Other | Source: Ambulatory Visit | Attending: Internal Medicine | Admitting: Internal Medicine

## 2019-11-06 ENCOUNTER — Other Ambulatory Visit: Payer: Self-pay | Admitting: Internal Medicine

## 2019-11-06 DIAGNOSIS — M25532 Pain in left wrist: Secondary | ICD-10-CM

## 2019-12-30 ENCOUNTER — Encounter: Payer: Self-pay | Admitting: Podiatry

## 2019-12-30 ENCOUNTER — Other Ambulatory Visit: Payer: Self-pay

## 2019-12-30 ENCOUNTER — Ambulatory Visit: Payer: Medicare Other | Admitting: Podiatry

## 2019-12-30 DIAGNOSIS — R209 Unspecified disturbances of skin sensation: Secondary | ICD-10-CM | POA: Insufficient documentation

## 2019-12-30 DIAGNOSIS — E041 Nontoxic single thyroid nodule: Secondary | ICD-10-CM | POA: Insufficient documentation

## 2019-12-30 DIAGNOSIS — M79674 Pain in right toe(s): Secondary | ICD-10-CM | POA: Diagnosis not present

## 2019-12-30 DIAGNOSIS — F419 Anxiety disorder, unspecified: Secondary | ICD-10-CM | POA: Insufficient documentation

## 2019-12-30 DIAGNOSIS — B351 Tinea unguium: Secondary | ICD-10-CM

## 2019-12-30 DIAGNOSIS — M4130 Thoracogenic scoliosis, site unspecified: Secondary | ICD-10-CM | POA: Insufficient documentation

## 2019-12-30 DIAGNOSIS — J309 Allergic rhinitis, unspecified: Secondary | ICD-10-CM | POA: Insufficient documentation

## 2019-12-30 DIAGNOSIS — R928 Other abnormal and inconclusive findings on diagnostic imaging of breast: Secondary | ICD-10-CM | POA: Insufficient documentation

## 2019-12-30 DIAGNOSIS — Z853 Personal history of malignant neoplasm of breast: Secondary | ICD-10-CM | POA: Insufficient documentation

## 2019-12-30 DIAGNOSIS — I10 Essential (primary) hypertension: Secondary | ICD-10-CM | POA: Insufficient documentation

## 2019-12-30 DIAGNOSIS — E785 Hyperlipidemia, unspecified: Secondary | ICD-10-CM | POA: Insufficient documentation

## 2019-12-30 DIAGNOSIS — M79675 Pain in left toe(s): Secondary | ICD-10-CM | POA: Diagnosis not present

## 2020-01-01 NOTE — Progress Notes (Signed)
Subjective:  Patient ID: Whitney Miller, female    DOB: 24-May-1940,  MRN: [id number]  Whitney Miller presents to clinic today for painful thick toenails that are difficult to trim. Pain interferes with ambulation. Aggravating factors include wearing enclosed shoe gear. Pain is relieved with periodic professional debridement..  79 y.o. female presents with the above complaint. She states she has been applying the compounded topical antifungal solution to her toenails as instructed. She thinks her toenails look a little better now.  Review of Systems: Negative except as noted in the HPI. Past Medical History:  Diagnosis Date  . Breast cancer ([facility]) 02/2000   Rt  . Personal history of radiation therapy    Past Surgical History:  Procedure Laterality Date  . BREAST LUMPECTOMY Right 02/2000    Current Outpatient Medications:  .  citalopram (CELEXA) 20 MG tablet, , Disp: , Rfl:  .  fluticasone (FLONASE) 50 MCG/ACT nasal spray, Place 1 spray into both nostrils daily., Disp: , Rfl:  .  lisinopril-hydrochlorothiazide (PRINZIDE,ZESTORETIC) 20-12.5 MG tablet, , Disp: , Rfl:  .  metoprolol tartrate (LOPRESSOR) 100 MG tablet, , Disp: , Rfl:  .  NONFORMULARY OR COMPOUNDED ITEM, Antifungal solution: Terbinafine 3%, Fluconazole 2%, Tea Tree Oil 5%, Urea 10%, Ibuprofen 2% in DMSO suspension #15mL, Disp: 1 each, Rfl: 3 .  simvastatin (ZOCOR) 40 MG tablet, , Disp: , Rfl:  Allergies  Allergen Reactions  . Penicillins Hives  . Tylenol [Acetaminophen] Hives   Social History   Occupational History  . Not on file  Tobacco Use  . Smoking status: Never Smoker  . Smokeless tobacco: Never Used  Substance and Sexual Activity  . Alcohol use: Not on file  . Drug use: Not on file  . Sexual activity: Not on file    Objective:   Constitutional Whitney Miller is a pleasant 79 y.o. African American female, in NAD. AAO x 3.   Vascular Capillary refill time to digits immediate b/l. Palpable  pedal pulses b/l LE. Pedal hair sparse. Lower extremity skin temperature gradient within normal limits. No pain with calf compression b/l. No cyanosis or clubbing noted.  Neurologic Normal speech. Oriented to person, place, and time. Protective sensation intact 5/5 intact bilaterally with 10g monofilament b/l. Vibratory sensation intact b/l.  Dermatologic Pedal skin with normal turgor, texture and tone bilaterally. No open wounds bilaterally. No interdigital macerations bilaterally. Toenails 1-5 b/l elongated, discolored, dystrophic, thickened, crumbly with subungual debris and tenderness to dorsal palpation.  Orthopedic: Normal muscle strength 5/5 to all lower extremity muscle groups bilaterally. No pain crepitus or joint limitation noted with ROM b/l. Hallux valgus with bunion deformity noted b/l lower extremities. Pes cavus deformity noted b/l.  Patient ambulates independent of any assistive aids.   Radiographs: None Assessment:   1. Pain due to onychomycosis of toenails of both feet    Plan:  Patient was evaluated and treated and all questions answered.  Onychomycosis with pain -Nails palliatively debridement as below -Educated on self-care  Procedure: Nail Debridement Rationale: Pain Type of Debridement: manual, sharp debridement. Instrumentation: Nail nipper, rotary burr. Number of Nails: 10 -Examined patient. -Patient to continue soft, supportive shoe gear daily. -Patient instructed to continue topical antifungal solution to toenails once daily from [place]. -Toenails 1-5 b/l were debrided in length and girth with sterile nail nippers and dremel without iatrogenic bleeding.  -Patient to report any pedal injuries to medical professional immediately. -Patient/POA to call should there be question/concern in the interim.  No follow-ups  on file.  Freddie Breech, DPM

## 2020-01-08 DIAGNOSIS — I10 Essential (primary) hypertension: Secondary | ICD-10-CM | POA: Diagnosis not present

## 2020-01-08 DIAGNOSIS — E785 Hyperlipidemia, unspecified: Secondary | ICD-10-CM | POA: Diagnosis not present

## 2020-01-08 DIAGNOSIS — Z0000 Encounter for general adult medical examination without abnormal findings: Secondary | ICD-10-CM | POA: Diagnosis not present

## 2020-01-08 DIAGNOSIS — Z853 Personal history of malignant neoplasm of breast: Secondary | ICD-10-CM | POA: Diagnosis not present

## 2020-01-08 DIAGNOSIS — Z23 Encounter for immunization: Secondary | ICD-10-CM | POA: Diagnosis not present

## 2020-02-01 DIAGNOSIS — Z853 Personal history of malignant neoplasm of breast: Secondary | ICD-10-CM | POA: Diagnosis not present

## 2020-02-01 DIAGNOSIS — E785 Hyperlipidemia, unspecified: Secondary | ICD-10-CM | POA: Diagnosis not present

## 2020-02-01 DIAGNOSIS — I10 Essential (primary) hypertension: Secondary | ICD-10-CM | POA: Diagnosis not present

## 2020-03-14 DIAGNOSIS — Z853 Personal history of malignant neoplasm of breast: Secondary | ICD-10-CM | POA: Diagnosis not present

## 2020-03-14 DIAGNOSIS — E785 Hyperlipidemia, unspecified: Secondary | ICD-10-CM | POA: Diagnosis not present

## 2020-03-14 DIAGNOSIS — I10 Essential (primary) hypertension: Secondary | ICD-10-CM | POA: Diagnosis not present

## 2020-04-05 ENCOUNTER — Ambulatory Visit: Payer: Medicare Other | Admitting: Podiatry

## 2020-05-13 DIAGNOSIS — I10 Essential (primary) hypertension: Secondary | ICD-10-CM | POA: Diagnosis not present

## 2020-05-13 DIAGNOSIS — E785 Hyperlipidemia, unspecified: Secondary | ICD-10-CM | POA: Diagnosis not present

## 2020-06-24 DIAGNOSIS — Z853 Personal history of malignant neoplasm of breast: Secondary | ICD-10-CM | POA: Diagnosis not present

## 2020-06-24 DIAGNOSIS — I10 Essential (primary) hypertension: Secondary | ICD-10-CM | POA: Diagnosis not present

## 2020-06-24 DIAGNOSIS — E785 Hyperlipidemia, unspecified: Secondary | ICD-10-CM | POA: Diagnosis not present

## 2020-07-18 ENCOUNTER — Other Ambulatory Visit: Payer: Self-pay

## 2020-07-18 ENCOUNTER — Ambulatory Visit: Payer: Medicare Other | Admitting: Podiatry

## 2020-07-18 ENCOUNTER — Encounter: Payer: Self-pay | Admitting: Podiatry

## 2020-07-18 DIAGNOSIS — M79675 Pain in left toe(s): Secondary | ICD-10-CM | POA: Diagnosis not present

## 2020-07-18 DIAGNOSIS — B351 Tinea unguium: Secondary | ICD-10-CM

## 2020-07-18 DIAGNOSIS — M79674 Pain in right toe(s): Secondary | ICD-10-CM | POA: Diagnosis not present

## 2020-07-20 DIAGNOSIS — E785 Hyperlipidemia, unspecified: Secondary | ICD-10-CM | POA: Diagnosis not present

## 2020-07-20 DIAGNOSIS — Z853 Personal history of malignant neoplasm of breast: Secondary | ICD-10-CM | POA: Diagnosis not present

## 2020-07-20 DIAGNOSIS — I10 Essential (primary) hypertension: Secondary | ICD-10-CM | POA: Diagnosis not present

## 2020-07-20 DIAGNOSIS — N289 Disorder of kidney and ureter, unspecified: Secondary | ICD-10-CM | POA: Diagnosis not present

## 2020-07-20 DIAGNOSIS — Z0000 Encounter for general adult medical examination without abnormal findings: Secondary | ICD-10-CM | POA: Diagnosis not present

## 2020-07-20 DIAGNOSIS — N1831 Chronic kidney disease, stage 3a: Secondary | ICD-10-CM | POA: Diagnosis not present

## 2020-07-20 DIAGNOSIS — Z1231 Encounter for screening mammogram for malignant neoplasm of breast: Secondary | ICD-10-CM | POA: Diagnosis not present

## 2020-07-21 NOTE — Progress Notes (Signed)
Subjective: Whitney Miller is a pleasant 80 y.o. female patient seen today painful thick toenails that are difficult to trim. Pain interferes with ambulation. Aggravating factors include wearing enclosed shoe gear. Pain is relieved with periodic professional debridement.  PCP is Nolene Ebbs, MD.   Allergies  Allergen Reactions   Penicillins Hives   Tylenol [Acetaminophen] Hives    Objective: Physical Exam  General: Whitney Miller is a pleasant 80 y.o. African American female, in NAD. AAO x 3.   Vascular:  Capillary refill time to digits immediate b/l. Palpable DP pulse(s) b/l lower extremities Palpable PT pulse(s) b/l lower extremities Pedal hair sparse. Lower extremity skin temperature gradient within normal limits. No pain with calf compression b/l.  Dermatological:  Pedal skin with normal turgor, texture and tone b/l lower extremities. No open wounds b/l lower extremities. No interdigital macerations b/l lower extremities. Toenails 1-5 b/l elongated, discolored, dystrophic, thickened, crumbly with subungual debris and tenderness to dorsal palpation.  Musculoskeletal:  Normal muscle strength 5/5 to all lower extremity muscle groups bilaterally. No pain crepitus or joint limitation noted with ROM b/l. Hallux valgus with bunion deformity noted b/l lower extremities. Pes cavus deformity noted b/l.   Neurological:  Protective sensation intact 5/5 intact bilaterally with 10g monofilament b/l. Vibratory sensation intact b/l.  Assessment and Plan:  1. Pain due to onychomycosis of toenails of both feet   -No new findings. No new orders. -Patient to continue soft, supportive shoe gear daily. -Toenails 1-5 b/l were debrided in length and girth with sterile nail nippers and dremel without iatrogenic bleeding.  -Patient to report any pedal injuries to medical professional immediately. -Patient/POA to call should there be question/concern in the interim.  Return in about 3  months (around 10/18/2020).  Marzetta Board, DPM

## 2020-08-15 DIAGNOSIS — E785 Hyperlipidemia, unspecified: Secondary | ICD-10-CM | POA: Diagnosis not present

## 2020-08-15 DIAGNOSIS — I10 Essential (primary) hypertension: Secondary | ICD-10-CM | POA: Diagnosis not present

## 2020-08-15 DIAGNOSIS — Z853 Personal history of malignant neoplasm of breast: Secondary | ICD-10-CM | POA: Diagnosis not present

## 2020-10-18 ENCOUNTER — Encounter: Payer: Self-pay | Admitting: Podiatry

## 2020-10-18 ENCOUNTER — Ambulatory Visit: Payer: Medicare Other | Admitting: Podiatry

## 2020-10-18 ENCOUNTER — Other Ambulatory Visit: Payer: Self-pay

## 2020-10-18 DIAGNOSIS — B351 Tinea unguium: Secondary | ICD-10-CM

## 2020-10-18 DIAGNOSIS — M79675 Pain in left toe(s): Secondary | ICD-10-CM

## 2020-10-18 DIAGNOSIS — K5900 Constipation, unspecified: Secondary | ICD-10-CM | POA: Insufficient documentation

## 2020-10-18 DIAGNOSIS — M79674 Pain in right toe(s): Secondary | ICD-10-CM | POA: Diagnosis not present

## 2020-10-18 DIAGNOSIS — Z1211 Encounter for screening for malignant neoplasm of colon: Secondary | ICD-10-CM | POA: Insufficient documentation

## 2020-10-23 NOTE — Progress Notes (Signed)
  Subjective:  Patient ID: Whitney Miller, female    DOB: [birth year],  MRN: [id number]  Whitney Miller presents to clinic today for thick, elongated toenails b/l feet which are tender when wearing enclosed shoe gear.  She notes no new pedal problems on today's visit.  PCP is Leeroy Cha, MD , and last visit was 05/13/2020.  Allergies  Allergen Reactions   Penicillins Hives   Tylenol [Acetaminophen] Hives    Review of Systems: Negative except as noted in the HPI. Objective:   Constitutional Whitney Miller is a pleasant 80 y.o. African American female, WD, WN in NAD. AAO x 3.   Vascular Capillary refill time to digits immediate b/l. Palpable pedal pulses b/l LE. Pedal hair sparse. Lower extremity skin temperature gradient within normal limits. No pain with calf compression b/l. No edema noted b/l lower extremities. No cyanosis or clubbing noted.  Neurologic Normal speech. Oriented to person, place, and time. Protective sensation intact 5/5 intact bilaterally with 10g monofilament b/l.  Dermatologic Skin warm and supple b/l lower extremities. No open wounds b/l LE. No interdigital macerations b/l lower extremities. Toenails 1-5 b/l elongated, discolored, dystrophic, thickened, crumbly with subungual debris and tenderness to dorsal palpation.  Orthopedic: Normal muscle strength 5/5 to all lower extremity muscle groups bilaterally. Hallux valgus with bunion deformity noted b/l lower extremities. Pes cavus deformity noted b/l feet.   Radiographs: None Assessment:   1. Pain due to onychomycosis of toenails of both feet    Plan:  Patient was evaluated and treated and all questions answered. Consent given for treatment as described below: -Mycotic toenails were debrided in length and girth with sterile nail nippers and dremel without iatrogenic bleeding. -Patient to report any pedal injuries to medical professional immediately. -Patient/POA to call should there be  question/concern in the interim.  Return in about 3 months (around 01/18/2021).  Marzetta Board, DPM

## 2020-12-08 DIAGNOSIS — E785 Hyperlipidemia, unspecified: Secondary | ICD-10-CM | POA: Diagnosis not present

## 2020-12-08 DIAGNOSIS — Z853 Personal history of malignant neoplasm of breast: Secondary | ICD-10-CM | POA: Diagnosis not present

## 2020-12-08 DIAGNOSIS — I10 Essential (primary) hypertension: Secondary | ICD-10-CM | POA: Diagnosis not present

## 2020-12-20 ENCOUNTER — Other Ambulatory Visit: Payer: Self-pay | Admitting: Internal Medicine

## 2020-12-20 DIAGNOSIS — Z1231 Encounter for screening mammogram for malignant neoplasm of breast: Secondary | ICD-10-CM

## 2021-01-11 ENCOUNTER — Ambulatory Visit
Admission: RE | Admit: 2021-01-11 | Discharge: 2021-01-11 | Disposition: A | Discharge status: Home / Self Care | Payer: Medicare Other | Source: Ambulatory Visit | Attending: Internal Medicine | Admitting: Internal Medicine

## 2021-01-11 DIAGNOSIS — Z1231 Encounter for screening mammogram for malignant neoplasm of breast: Secondary | ICD-10-CM

## 2021-01-19 ENCOUNTER — Ambulatory Visit: Payer: Medicare Other

## 2021-01-19 DIAGNOSIS — T148XXA Other injury of unspecified body region, initial encounter: Secondary | ICD-10-CM | POA: Diagnosis not present

## 2021-01-19 DIAGNOSIS — E785 Hyperlipidemia, unspecified: Secondary | ICD-10-CM | POA: Diagnosis not present

## 2021-01-19 DIAGNOSIS — N1831 Chronic kidney disease, stage 3a: Secondary | ICD-10-CM | POA: Diagnosis not present

## 2021-01-19 DIAGNOSIS — I10 Essential (primary) hypertension: Secondary | ICD-10-CM | POA: Diagnosis not present

## 2021-01-19 DIAGNOSIS — Z23 Encounter for immunization: Secondary | ICD-10-CM | POA: Diagnosis not present

## 2021-01-25 ENCOUNTER — Encounter: Payer: Self-pay | Admitting: Podiatry

## 2021-01-25 ENCOUNTER — Ambulatory Visit (INDEPENDENT_AMBULATORY_CARE_PROVIDER_SITE_OTHER): Payer: Medicare Other | Admitting: Podiatry

## 2021-01-25 ENCOUNTER — Other Ambulatory Visit: Payer: Self-pay

## 2021-01-25 DIAGNOSIS — N1831 Chronic kidney disease, stage 3a: Secondary | ICD-10-CM | POA: Insufficient documentation

## 2021-01-25 DIAGNOSIS — M79675 Pain in left toe(s): Secondary | ICD-10-CM | POA: Diagnosis not present

## 2021-01-25 DIAGNOSIS — M79674 Pain in right toe(s): Secondary | ICD-10-CM

## 2021-01-25 DIAGNOSIS — F3341 Major depressive disorder, recurrent, in partial remission: Secondary | ICD-10-CM | POA: Insufficient documentation

## 2021-01-25 DIAGNOSIS — B351 Tinea unguium: Secondary | ICD-10-CM

## 2021-02-01 NOTE — Progress Notes (Signed)
Subjective: Whitney Miller is a pleasant 81 y.o. female patient seen today for thick, elongated toenails 1-5 bilaterally which are tender when wearing enclosed shoe gear.   PCP is Leeroy Cha, MD. Last visit was: 01/19/2021.  Allergies  Allergen Reactions   Penicillins Hives   Tylenol [Acetaminophen] Hives    Objective: Physical Exam  General: Whitney Miller is a pleasant 81 y.o. African American female, WD, WN in NAD. AAO x 3.   Vascular:  CFT immediate b/l LE. Palpable DP/PT pulses b/l LE. Digital hair sparse b/l. Skin temperature gradient WNL b/l. No pain with calf compression b/l. No edema noted b/l. No cyanosis or clubbing noted b/l LE.   Dermatological:  Pedal integument with normal turgor, texture and tone b/l LE. No open wounds b/l. No interdigital macerations b/l. Toenails 1-5 b/l elongated, thickened, discolored with subungual debris. +Tenderness with dorsal palpation of nailplates. No hyperkeratotic or porokeratotic lesions present.   Musculoskeletal:  Muscle strength 5/5 to all lower extremity muscle groups bilaterally. HAV with bunion deformity noted b/l LE. Pes cavus deformity noted b/l lower extremities. Patient ambulates independent of any assistive aids.   Neurological:  Protective sensation intact 5/5 intact bilaterally with 10g monofilament b/l.    Assessment and Plan:  1. Pain due to onychomycosis of toenails of both feet      Patient was evaluated and treated and all questions answered. Consent given for treatment as described below: -Toenails 1-5 b/l were debrided in length and girth with sterile nail nippers and dremel without iatrogenic bleeding.  -Patient/POA to call should there be question/concern in the interim.  Return in about 3 months (around 04/25/2021).  Marzetta Board, DPM

## 2021-03-01 DIAGNOSIS — Z0100 Encounter for examination of eyes and vision without abnormal findings: Secondary | ICD-10-CM | POA: Diagnosis not present

## 2021-04-25 ENCOUNTER — Ambulatory Visit (INDEPENDENT_AMBULATORY_CARE_PROVIDER_SITE_OTHER): Payer: No Typology Code available for payment source | Admitting: Podiatry

## 2021-04-25 ENCOUNTER — Encounter: Payer: Self-pay | Admitting: Podiatry

## 2021-04-25 DIAGNOSIS — B351 Tinea unguium: Secondary | ICD-10-CM

## 2021-04-25 DIAGNOSIS — M79675 Pain in left toe(s): Secondary | ICD-10-CM | POA: Diagnosis not present

## 2021-04-25 DIAGNOSIS — M79674 Pain in right toe(s): Secondary | ICD-10-CM | POA: Diagnosis not present

## 2021-05-03 NOTE — Progress Notes (Signed)
?  Subjective:  ?Patient ID: Whitney Miller, female    DOB: 1940-08-16,  MRN: [id number] ? ?Whitney Miller presents to clinic today for painful thick toenails that are difficult to trim. Pain interferes with ambulation. Aggravating factors include wearing enclosed shoe gear. Pain is relieved with periodic professional debridement. ? ?New problem(s): None.  ? ?PCP is Leeroy Cha, MD , and last visit was August 15, 2020. ? ?Allergies  ?Allergen Reactions  ? Penicillins Hives  ? Tylenol [Acetaminophen] Hives  ? ? ?Review of Systems: Negative except as noted in the HPI. ? ?Objective: No changes noted in today's physical examination. ? ?General: Whitney Miller is a pleasant 81 y.o. African American female, WD, WN in NAD. AAO x 3.  ? ?Vascular:  ?CFT immediate b/l LE. Palpable DP/PT pulses b/l LE. Digital hair sparse b/l. Skin temperature gradient WNL b/l. No pain with calf compression b/l. No edema noted b/l. No cyanosis or clubbing noted b/l LE.  ? ?Dermatological:  ?Pedal integument with normal turgor, texture and tone b/l LE. No open wounds b/l. No interdigital macerations b/l. Toenails 1-5 b/l elongated, thickened, discolored with subungual debris. +Tenderness with dorsal palpation of nailplates. No hyperkeratotic or porokeratotic lesions present.  ? ?Musculoskeletal:  ?Muscle strength 5/5 to all lower extremity muscle groups bilaterally. HAV with bunion deformity noted b/l LE. Pes cavus deformity noted b/l lower extremities. Patient ambulates independent of any assistive aids.  ? ?Neurological:  ?Protective sensation intact 5/5 intact bilaterally with 10g monofilament b/l.   ? ?Assessment/Plan: ?1. Pain due to onychomycosis of toenails of both feet   ?  ? ?-Patient was evaluated and treated. All patient's and/or POA's questions/concerns answered on today's visit. ?-Mycotic toenails 1-5 bilaterally were debrided in length and girth with sterile nail nippers and dremel without  incident. ?-Patient/POA to call should there be question/concern in the interim.  ? ?Return in about 3 months (around 07/25/2021). ? ?Marzetta Board, DPM  ?

## 2021-08-01 ENCOUNTER — Ambulatory Visit: Payer: No Typology Code available for payment source | Admitting: Podiatry

## 2021-08-01 ENCOUNTER — Encounter: Payer: Self-pay | Admitting: Podiatry

## 2021-08-01 DIAGNOSIS — M79674 Pain in right toe(s): Secondary | ICD-10-CM

## 2021-08-01 DIAGNOSIS — B351 Tinea unguium: Secondary | ICD-10-CM

## 2021-08-01 DIAGNOSIS — M79675 Pain in left toe(s): Secondary | ICD-10-CM

## 2021-08-06 NOTE — Progress Notes (Signed)
  Subjective:  Patient ID: Whitney Miller, female    DOB: 09-Jul-1940,  MRN: [id number]  MYRIA STEENBERGEN presents to clinic today for painful elongated mycotic toenails 1-5 bilaterally which are tender when wearing enclosed shoe gear. Pain is relieved with periodic professional debridement.  New problem(s): None.   PCP is Leeroy Cha, MD , and last visit was  July 31, 2021  Allergies  Allergen Reactions   Penicillins Hives   Tylenol [Acetaminophen] Hives    Review of Systems: Negative except as noted in the HPI.  Objective: No changes noted in today's physical examination. General: Whitney Miller is a pleasant 81 y.o. African American female, WD, WN in NAD. AAO x 3.   Vascular:  CFT immediate b/l LE. Palpable DP/PT pulses b/l LE. Digital hair sparse b/l. Skin temperature gradient WNL b/l. No pain with calf compression b/l. No edema noted b/l. No cyanosis or clubbing noted b/l LE.   Dermatological:  Pedal integument with normal turgor, texture and tone b/l LE. No open wounds b/l. No interdigital macerations b/l. Toenails 1-5 b/l elongated, thickened, discolored with subungual debris. +Tenderness with dorsal palpation of nailplates. No hyperkeratotic or porokeratotic lesions present.   Musculoskeletal:  Muscle strength 5/5 to all lower extremity muscle groups bilaterally. HAV with bunion deformity noted b/l LE. Pes cavus deformity noted b/l lower extremities. Patient ambulates independent of any assistive aids.   Neurological:  Protective sensation intact 5/5 intact bilaterally with 10g monofilament b/l.    Assessment/Plan: 1. Pain due to onychomycosis of toenails of both feet     -Examined patient. -Patient to continue soft, supportive shoe gear daily. -Toenails 1-5 b/l were debrided in length and girth with sterile nail nippers and dremel without iatrogenic bleeding.  -Patient/POA to call should there be question/concern in the interim.   Return in about  3 months (around 11/01/2021).  Marzetta Board, DPM

## 2021-08-08 DIAGNOSIS — M1611 Unilateral primary osteoarthritis, right hip: Secondary | ICD-10-CM | POA: Diagnosis not present

## 2021-08-08 DIAGNOSIS — M48061 Spinal stenosis, lumbar region without neurogenic claudication: Secondary | ICD-10-CM | POA: Diagnosis not present

## 2021-08-11 DIAGNOSIS — F3341 Major depressive disorder, recurrent, in partial remission: Secondary | ICD-10-CM | POA: Diagnosis not present

## 2021-08-11 DIAGNOSIS — Z853 Personal history of malignant neoplasm of breast: Secondary | ICD-10-CM | POA: Diagnosis not present

## 2021-08-11 DIAGNOSIS — E785 Hyperlipidemia, unspecified: Secondary | ICD-10-CM | POA: Diagnosis not present

## 2021-08-11 DIAGNOSIS — Z0000 Encounter for general adult medical examination without abnormal findings: Secondary | ICD-10-CM | POA: Diagnosis not present

## 2021-08-11 DIAGNOSIS — Z1331 Encounter for screening for depression: Secondary | ICD-10-CM | POA: Diagnosis not present

## 2021-08-11 DIAGNOSIS — J309 Allergic rhinitis, unspecified: Secondary | ICD-10-CM | POA: Diagnosis not present

## 2021-08-11 DIAGNOSIS — M4135 Thoracogenic scoliosis, thoracolumbar region: Secondary | ICD-10-CM | POA: Diagnosis not present

## 2021-08-11 DIAGNOSIS — N1831 Chronic kidney disease, stage 3a: Secondary | ICD-10-CM | POA: Diagnosis not present

## 2021-08-11 DIAGNOSIS — R262 Difficulty in walking, not elsewhere classified: Secondary | ICD-10-CM | POA: Diagnosis not present

## 2021-08-11 DIAGNOSIS — M1610 Unilateral primary osteoarthritis, unspecified hip: Secondary | ICD-10-CM | POA: Diagnosis not present

## 2021-08-11 DIAGNOSIS — I10 Essential (primary) hypertension: Secondary | ICD-10-CM | POA: Diagnosis not present

## 2021-08-16 DIAGNOSIS — M25551 Pain in right hip: Secondary | ICD-10-CM | POA: Insufficient documentation

## 2021-08-30 DIAGNOSIS — M25551 Pain in right hip: Secondary | ICD-10-CM | POA: Diagnosis not present

## 2021-09-20 DIAGNOSIS — M4180 Other forms of scoliosis, site unspecified: Secondary | ICD-10-CM | POA: Diagnosis not present

## 2021-09-20 DIAGNOSIS — M25551 Pain in right hip: Secondary | ICD-10-CM | POA: Diagnosis not present

## 2021-10-09 DIAGNOSIS — M4185 Other forms of scoliosis, thoracolumbar region: Secondary | ICD-10-CM | POA: Diagnosis not present

## 2021-10-09 DIAGNOSIS — M419 Scoliosis, unspecified: Secondary | ICD-10-CM | POA: Insufficient documentation

## 2021-10-16 DIAGNOSIS — M4185 Other forms of scoliosis, thoracolumbar region: Secondary | ICD-10-CM | POA: Diagnosis not present

## 2021-10-23 DIAGNOSIS — M4185 Other forms of scoliosis, thoracolumbar region: Secondary | ICD-10-CM | POA: Diagnosis not present

## 2021-10-30 DIAGNOSIS — M4185 Other forms of scoliosis, thoracolumbar region: Secondary | ICD-10-CM | POA: Diagnosis not present

## 2021-11-01 DIAGNOSIS — M1611 Unilateral primary osteoarthritis, right hip: Secondary | ICD-10-CM | POA: Diagnosis not present

## 2021-11-01 DIAGNOSIS — M4180 Other forms of scoliosis, site unspecified: Secondary | ICD-10-CM | POA: Diagnosis not present

## 2021-11-01 DIAGNOSIS — M25551 Pain in right hip: Secondary | ICD-10-CM | POA: Diagnosis not present

## 2021-11-06 DIAGNOSIS — Z6827 Body mass index (BMI) 27.0-27.9, adult: Secondary | ICD-10-CM | POA: Diagnosis not present

## 2021-11-06 DIAGNOSIS — I10 Essential (primary) hypertension: Secondary | ICD-10-CM | POA: Diagnosis not present

## 2021-11-06 DIAGNOSIS — Z853 Personal history of malignant neoplasm of breast: Secondary | ICD-10-CM | POA: Diagnosis not present

## 2021-11-06 DIAGNOSIS — F324 Major depressive disorder, single episode, in partial remission: Secondary | ICD-10-CM | POA: Diagnosis not present

## 2021-11-06 DIAGNOSIS — E785 Hyperlipidemia, unspecified: Secondary | ICD-10-CM | POA: Diagnosis not present

## 2021-11-06 DIAGNOSIS — R2681 Unsteadiness on feet: Secondary | ICD-10-CM | POA: Diagnosis not present

## 2021-11-06 DIAGNOSIS — Z008 Encounter for other general examination: Secondary | ICD-10-CM | POA: Diagnosis not present

## 2021-11-06 DIAGNOSIS — E663 Overweight: Secondary | ICD-10-CM | POA: Diagnosis not present

## 2021-11-14 ENCOUNTER — Ambulatory Visit: Payer: No Typology Code available for payment source | Admitting: Podiatry

## 2021-11-14 ENCOUNTER — Encounter: Payer: Self-pay | Admitting: Podiatry

## 2021-11-14 DIAGNOSIS — M79674 Pain in right toe(s): Secondary | ICD-10-CM | POA: Diagnosis not present

## 2021-11-14 DIAGNOSIS — M79675 Pain in left toe(s): Secondary | ICD-10-CM

## 2021-11-14 DIAGNOSIS — B351 Tinea unguium: Secondary | ICD-10-CM | POA: Diagnosis not present

## 2021-11-19 NOTE — Progress Notes (Signed)
  Subjective:  Patient ID: Whitney Miller, female    DOB: 1940-02-18,  MRN: [id number]  Whitney Miller presents to clinic today for painful thick toenails that are difficult to trim. Pain interferes with ambulation. Aggravating factors include wearing enclosed shoe gear. Pain is relieved with periodic professional debridement.  Chief Complaint  Patient presents with   Nail Problem    Nail Trim Not Diabetic PCP - Dr Vertis Kelch , last OV July 2023   New problem(s): None.   PCP is Leeroy Cha, MD.  Allergies  Allergen Reactions   Penicillins Hives   Tylenol [Acetaminophen] Hives    Review of Systems: Negative except as noted in the HPI.  Objective: No changes noted in today's physical examination.  Whitney Miller is a pleasant 81 y.o. female WD, WN in NAD. AAO x 3. Vascular:  CFT immediate b/l LE. Palpable DP/PT pulses b/l LE. Digital hair sparse b/l. Skin temperature gradient WNL b/l. No pain with calf compression b/l. No edema noted b/l. No cyanosis or clubbing noted b/l LE.   Dermatological:  Pedal integument with normal turgor, texture and tone b/l LE. No open wounds b/l. No interdigital macerations b/l. Toenails 1-5 b/l elongated, thickened, discolored with subungual debris. +Tenderness with dorsal palpation of nailplates. No hyperkeratotic or porokeratotic lesions present.   Musculoskeletal:  Muscle strength 5/5 to all lower extremity muscle groups bilaterally. HAV with bunion deformity noted b/l LE. Pes cavus deformity noted b/l lower extremities. Patient ambulates independent of any assistive aids.   Neurological:  Protective sensation intact 5/5 intact bilaterally with 10g monofilament b/l.   Assessment/Plan: 1. Pain due to onychomycosis of toenails of both feet     No orders of the defined types were placed in this encounter.   -Consent given for treatment as described below: -Continue supportive shoe gear daily. -Mycotic toenails 1-5  bilaterally were debrided in length and girth with sterile nail nippers and dremel without incident. -Patient/POA to call should there be question/concern in the interim.   Return in about 3 months (around 02/14/2022).  Marzetta Board, DPM

## 2022-02-09 DIAGNOSIS — Z9989 Dependence on other enabling machines and devices: Secondary | ICD-10-CM | POA: Diagnosis not present

## 2022-02-09 DIAGNOSIS — I10 Essential (primary) hypertension: Secondary | ICD-10-CM | POA: Diagnosis not present

## 2022-02-09 DIAGNOSIS — Z23 Encounter for immunization: Secondary | ICD-10-CM | POA: Diagnosis not present

## 2022-02-09 DIAGNOSIS — F3341 Major depressive disorder, recurrent, in partial remission: Secondary | ICD-10-CM | POA: Diagnosis not present

## 2022-02-09 DIAGNOSIS — N1831 Chronic kidney disease, stage 3a: Secondary | ICD-10-CM | POA: Diagnosis not present

## 2022-03-06 ENCOUNTER — Ambulatory Visit: Payer: No Typology Code available for payment source | Admitting: Podiatry

## 2022-03-06 ENCOUNTER — Encounter: Payer: Self-pay | Admitting: Podiatry

## 2022-03-06 VITALS — BP 168/82

## 2022-03-06 DIAGNOSIS — M79674 Pain in right toe(s): Secondary | ICD-10-CM

## 2022-03-06 DIAGNOSIS — M79675 Pain in left toe(s): Secondary | ICD-10-CM

## 2022-03-06 DIAGNOSIS — B351 Tinea unguium: Secondary | ICD-10-CM

## 2022-03-06 NOTE — Progress Notes (Unsigned)
  Subjective:  Patient ID: Whitney Miller, female    DOB: 13-Dec-1940,  MRN: [id number]  Whitney Miller presents to clinic today for {jgcomplaint:23593}  Chief Complaint  Patient presents with   Nail Problem    RFC PCP-Varadarajan PCP VST- 3 weeks ago   New problem(s): None. {jgcomplaint:23593}  PCP is Leeroy Cha, MD.  Allergies  Allergen Reactions   Penicillins Hives   Tylenol [Acetaminophen] Hives    Review of Systems: Negative except as noted in the HPI.  Objective: No changes noted in today's physical examination. Vitals:   03/06/22 1539  BP: (!) 168/82   Whitney Miller is a pleasant 82 y.o. female {jgbodyhabitus:24098} AAO x 3. Vascular:  CFT immediate b/l LE. Palpable DP/PT pulses b/l LE. Digital hair sparse b/l. Skin temperature gradient WNL b/l. No pain with calf compression b/l. No edema noted b/l. No cyanosis or clubbing noted b/l LE.   Dermatological:  Pedal integument with normal turgor, texture and tone b/l LE. No open wounds b/l. No interdigital macerations b/l. Toenails 1-5 b/l elongated, thickened, discolored with subungual debris. +Tenderness with dorsal palpation of nailplates. No hyperkeratotic or porokeratotic lesions present.   Musculoskeletal:  Muscle strength 5/5 to all lower extremity muscle groups bilaterally. HAV with bunion deformity noted b/l LE. Pes cavus deformity noted b/l lower extremities. Patient ambulates independent of any assistive aids.   Neurological:  Protective sensation intact 5/5 intact bilaterally with 10g monofilament b/l.   Assessment/Plan: No diagnosis found.  No orders of the defined types were placed in this encounter.   None {Jgplan:23602::"-Patient/POA to call should there be question/concern in the interim."}   Return in about 3 months (around 06/06/2022).  Marzetta Board, DPM

## 2022-07-03 ENCOUNTER — Ambulatory Visit (INDEPENDENT_AMBULATORY_CARE_PROVIDER_SITE_OTHER): Payer: No Typology Code available for payment source | Admitting: Podiatry

## 2022-07-03 DIAGNOSIS — B351 Tinea unguium: Secondary | ICD-10-CM

## 2022-07-03 DIAGNOSIS — M79675 Pain in left toe(s): Secondary | ICD-10-CM | POA: Diagnosis not present

## 2022-07-03 DIAGNOSIS — M79674 Pain in right toe(s): Secondary | ICD-10-CM

## 2022-07-03 DIAGNOSIS — Z853 Personal history of malignant neoplasm of breast: Secondary | ICD-10-CM | POA: Insufficient documentation

## 2022-07-03 NOTE — Progress Notes (Signed)
  Subjective:  Patient ID: Whitney Miller, female    DOB: [birth year],  MRN: [id number]  TIARE DEEGAN presents to clinic today for: painful thick toenails that are difficult to trim. Pain interferes with ambulation. Aggravating factors include wearing enclosed shoe gear. Pain is relieved with periodic professional debridement.  Chief Complaint  Patient presents with   NAIL CARE    RFC    PCP is Lorenda Ishihara, MD.  Allergies  Allergen Reactions   Penicillins Hives   Tylenol [Acetaminophen] Hives   Review of Systems: Negative except as noted in the HPI.  Objective: No changes noted in today's physical examination. There were no vitals filed for this visit.  Whitney Miller is a pleasant 82 y.o. female in NAD. AAO x 3.  Vascular Examination: Capillary refill time <3 seconds b/l LE. Palpable pedal pulses b/l LE. Digital hair present b/l. No pedal edema b/l. Skin temperature gradient WNL b/l. No varicosities b/l. Marland Kitchen  Dermatological Examination: Pedal skin with normal turgor, texture and tone b/l. No open wounds. No interdigital macerations b/l. Toenails 1-5 b/l thickened, discolored, dystrophic with subungual debris. There is pain on palpation to dorsal aspect of nailplates. .  Neurological Examination: Protective sensation intact with 10 gram monofilament b/l LE. Vibratory sensation intact b/l LE.   Musculoskeletal Examination: Muscle strength 5/5 to all lower extremity muscle groups bilaterally. HAV with bunion deformity noted b/l LE. Pes cavus deformity noted b/l lower extremities.  Assessment/Plan: 1. Pain due to onychomycosis of toenails of both feet     Patient was evaluated and treated. All patient's and/or POA's questions/concerns addressed on today's visit. Mycotic toenails 1-5 debrided in length and girth without incident. Continue soft, supportive shoe gear daily. Report any pedal injuries to medical professional. Call office if there are any  quesitons/concerns. -Patient/POA to call should there be question/concern in the interim.   Return in about 3 months (around 10/03/2022).  Freddie Breech, DPM

## 2022-07-09 ENCOUNTER — Encounter: Payer: Self-pay | Admitting: Podiatry

## 2022-08-21 DIAGNOSIS — Z0000 Encounter for general adult medical examination without abnormal findings: Secondary | ICD-10-CM | POA: Diagnosis not present

## 2022-08-21 DIAGNOSIS — F3341 Major depressive disorder, recurrent, in partial remission: Secondary | ICD-10-CM | POA: Diagnosis not present

## 2022-08-21 DIAGNOSIS — E041 Nontoxic single thyroid nodule: Secondary | ICD-10-CM | POA: Diagnosis not present

## 2022-08-21 DIAGNOSIS — Z9989 Dependence on other enabling machines and devices: Secondary | ICD-10-CM | POA: Diagnosis not present

## 2022-08-21 DIAGNOSIS — J309 Allergic rhinitis, unspecified: Secondary | ICD-10-CM | POA: Diagnosis not present

## 2022-08-21 DIAGNOSIS — E785 Hyperlipidemia, unspecified: Secondary | ICD-10-CM | POA: Diagnosis not present

## 2022-08-21 DIAGNOSIS — N1831 Chronic kidney disease, stage 3a: Secondary | ICD-10-CM | POA: Diagnosis not present

## 2022-08-21 DIAGNOSIS — Z853 Personal history of malignant neoplasm of breast: Secondary | ICD-10-CM | POA: Diagnosis not present

## 2022-08-21 DIAGNOSIS — M4135 Thoracogenic scoliosis, thoracolumbar region: Secondary | ICD-10-CM | POA: Diagnosis not present

## 2022-08-21 DIAGNOSIS — I10 Essential (primary) hypertension: Secondary | ICD-10-CM | POA: Diagnosis not present

## 2022-08-21 DIAGNOSIS — Z1331 Encounter for screening for depression: Secondary | ICD-10-CM | POA: Diagnosis not present

## 2022-09-25 ENCOUNTER — Ambulatory Visit
Admission: RE | Admit: 2022-09-25 | Discharge: 2022-09-25 | Disposition: A | Discharge status: Home / Self Care | Payer: No Typology Code available for payment source | Source: Ambulatory Visit | Attending: Internal Medicine | Admitting: Internal Medicine

## 2022-09-25 ENCOUNTER — Other Ambulatory Visit: Payer: Self-pay | Admitting: Internal Medicine

## 2022-09-25 DIAGNOSIS — Z1231 Encounter for screening mammogram for malignant neoplasm of breast: Secondary | ICD-10-CM

## 2022-09-25 DIAGNOSIS — I10 Essential (primary) hypertension: Secondary | ICD-10-CM | POA: Diagnosis not present

## 2022-09-25 DIAGNOSIS — N1831 Chronic kidney disease, stage 3a: Secondary | ICD-10-CM | POA: Diagnosis not present

## 2022-09-25 DIAGNOSIS — Z23 Encounter for immunization: Secondary | ICD-10-CM | POA: Diagnosis not present

## 2022-10-10 ENCOUNTER — Ambulatory Visit: Payer: No Typology Code available for payment source | Admitting: Podiatry

## 2022-10-10 ENCOUNTER — Encounter: Payer: Self-pay | Admitting: Podiatry

## 2022-10-10 DIAGNOSIS — M79674 Pain in right toe(s): Secondary | ICD-10-CM

## 2022-10-10 DIAGNOSIS — M79675 Pain in left toe(s): Secondary | ICD-10-CM | POA: Diagnosis not present

## 2022-10-10 DIAGNOSIS — B351 Tinea unguium: Secondary | ICD-10-CM

## 2022-10-14 NOTE — Progress Notes (Signed)
Subjective:  Patient ID: Whitney Miller, female    DOB: 1940/02/17,  MRN: [id number]  Whitney Miller presents to clinic today for: painful elongated mycotic toenails 1-5 bilaterally which are tender when wearing enclosed shoe gear. Pain is relieved with periodic professional debridement.  Chief Complaint  Patient presents with   RFC    PCP is Lorenda Ishihara, MD.  Allergies  Allergen Reactions   Penicillins Hives   Tylenol [Acetaminophen] Hives    Review of Systems: Negative except as noted in the HPI.  Objective: No changes noted in today's physical examination. There were no vitals filed for this visit.  Whitney Miller is a pleasant 82 y.o. female in NAD. AAO x 3.  Vascular Examination: Capillary refill time <3 seconds b/l LE. Palpable pedal pulses b/l LE. Digital hair present b/l. No pedal edema b/l. Skin temperature gradient WNL b/l. No varicosities b/l. Marland Kitchen  Dermatological Examination: Pedal skin with normal turgor, texture and tone b/l. No open wounds. No interdigital macerations b/l. Toenails 1-5 b/l thickened, discolored, dystrophic with subungual debris. There is pain on palpation to dorsal aspect of nailplates. No corns, calluses nor porokeratotic lesions noted..  Neurological Examination: Protective sensation intact with 10 gram monofilament b/l LE. Vibratory sensation intact b/l LE.   Musculoskeletal Examination: Muscle strength 5/5 to all lower extremity muscle groups bilaterally. HAV with bunion deformity noted b/l LE. Pes cavus deformity noted of both feet.  Assessment/Plan: 1. Pain due to onychomycosis of toenails of both feet    -Consent given for treatment as described below: -Examined patient. -No new findings. No new orders. -Toenails 1-5 b/l were debrided in length and girth with sterile nail nippers and dremel without iatrogenic bleeding.  -Patient/POA to call should there be question/concern in the interim.   Return in about 3  months (around 01/10/2023).  Freddie Breech, DPM

## 2023-01-23 DIAGNOSIS — I10 Essential (primary) hypertension: Secondary | ICD-10-CM | POA: Diagnosis not present

## 2023-01-23 DIAGNOSIS — N1831 Chronic kidney disease, stage 3a: Secondary | ICD-10-CM | POA: Diagnosis not present

## 2023-01-23 DIAGNOSIS — F3341 Major depressive disorder, recurrent, in partial remission: Secondary | ICD-10-CM | POA: Diagnosis not present

## 2023-01-23 DIAGNOSIS — J309 Allergic rhinitis, unspecified: Secondary | ICD-10-CM | POA: Diagnosis not present

## 2023-01-23 DIAGNOSIS — Z23 Encounter for immunization: Secondary | ICD-10-CM | POA: Diagnosis not present

## 2023-04-09 ENCOUNTER — Other Ambulatory Visit (HOSPITAL_COMMUNITY): Payer: Self-pay | Admitting: Internal Medicine

## 2023-04-09 DIAGNOSIS — R221 Localized swelling, mass and lump, neck: Secondary | ICD-10-CM | POA: Diagnosis not present

## 2023-04-09 DIAGNOSIS — R21 Rash and other nonspecific skin eruption: Secondary | ICD-10-CM | POA: Diagnosis not present

## 2023-04-09 DIAGNOSIS — Z853 Personal history of malignant neoplasm of breast: Secondary | ICD-10-CM | POA: Diagnosis not present

## 2023-04-11 ENCOUNTER — Ambulatory Visit (HOSPITAL_BASED_OUTPATIENT_CLINIC_OR_DEPARTMENT_OTHER)
Admission: RE | Admit: 2023-04-11 | Discharge: 2023-04-11 | Disposition: A | Discharge status: Home / Self Care | Source: Ambulatory Visit | Attending: Internal Medicine | Admitting: Internal Medicine

## 2023-04-11 DIAGNOSIS — M47812 Spondylosis without myelopathy or radiculopathy, cervical region: Secondary | ICD-10-CM | POA: Diagnosis not present

## 2023-04-11 DIAGNOSIS — R221 Localized swelling, mass and lump, neck: Secondary | ICD-10-CM | POA: Diagnosis not present

## 2023-04-11 DIAGNOSIS — E041 Nontoxic single thyroid nodule: Secondary | ICD-10-CM | POA: Diagnosis not present

## 2023-04-11 MED ORDER — IOHEXOL 300 MG/ML  SOLN
100.0000 mL | Freq: Once | INTRAMUSCULAR | Status: AC | PRN
  Administered 2023-04-11: 75 mL via INTRAVENOUS

## 2023-04-22 DIAGNOSIS — E041 Nontoxic single thyroid nodule: Secondary | ICD-10-CM | POA: Diagnosis not present

## 2023-04-23 ENCOUNTER — Other Ambulatory Visit: Payer: Self-pay | Admitting: Nurse Practitioner

## 2023-04-23 DIAGNOSIS — E041 Nontoxic single thyroid nodule: Secondary | ICD-10-CM

## 2023-05-03 ENCOUNTER — Other Ambulatory Visit

## 2023-05-08 ENCOUNTER — Ambulatory Visit
Admission: RE | Admit: 2023-05-08 | Discharge: 2023-05-08 | Disposition: A | Discharge status: Home / Self Care | Source: Ambulatory Visit | Attending: Nurse Practitioner | Admitting: Nurse Practitioner

## 2023-05-08 DIAGNOSIS — E041 Nontoxic single thyroid nodule: Secondary | ICD-10-CM | POA: Diagnosis not present

## 2023-05-08 DIAGNOSIS — R221 Localized swelling, mass and lump, neck: Secondary | ICD-10-CM | POA: Diagnosis not present

## 2023-05-08 DIAGNOSIS — E0789 Other specified disorders of thyroid: Secondary | ICD-10-CM | POA: Diagnosis not present

## 2023-05-10 ENCOUNTER — Ambulatory Visit: Admitting: Oncology

## 2023-05-10 ENCOUNTER — Other Ambulatory Visit

## 2023-06-07 ENCOUNTER — Institutional Professional Consult (permissible substitution) (INDEPENDENT_AMBULATORY_CARE_PROVIDER_SITE_OTHER): Admitting: Otolaryngology

## 2023-06-11 ENCOUNTER — Ambulatory Visit (INDEPENDENT_AMBULATORY_CARE_PROVIDER_SITE_OTHER): Admitting: Otolaryngology

## 2023-06-11 ENCOUNTER — Encounter (INDEPENDENT_AMBULATORY_CARE_PROVIDER_SITE_OTHER): Payer: Self-pay | Admitting: Otolaryngology

## 2023-06-11 VITALS — BP 119/76 | HR 60 | Ht 63.0 in | Wt 150.0 lb

## 2023-06-11 DIAGNOSIS — R221 Localized swelling, mass and lump, neck: Secondary | ICD-10-CM

## 2023-06-11 DIAGNOSIS — R490 Dysphonia: Secondary | ICD-10-CM | POA: Diagnosis not present

## 2023-06-11 DIAGNOSIS — E041 Nontoxic single thyroid nodule: Secondary | ICD-10-CM

## 2023-06-11 NOTE — Patient Instructions (Signed)
 I have ordered an imaging study for you to complete prior to your next visit. Please call [facility] Scheduling at [phone] to schedule your imaging if you have not received a call within 24 hours. If you are unable to complete your imaging study prior to your next scheduled visit please call our office to let us know.

## 2023-06-11 NOTE — Progress Notes (Signed)
 Dear Dr. Beauty Bourbon, Here is my assessment for our mutual patient, Whitney Miller. Thank you for allowing me the opportunity to care for your patient. Please do not hesitate to contact me should you have any other questions. Sincerely, Dr. Milon Aloe  Otolaryngology Clinic Note Referring provider: Dr. Beauty Bourbon HPI:  Whitney Miller is a 83 y.o. female kindly referred by Dr. Varadarajan for evaluation of neck mass  Initial visit (06/2023):  First noted right neck mass about 4 months ago, incidentally. She has not noted thyroid  mass. Seems like getting larger, sometimes difficult to turn neck to left as a result. Not painful, no trouble swallowing or breathing, but reports voice is more tremulous. No prior skin cancers, denies tobacco use.   Compressive symptoms: see above Hypo or hyperthyroid symptoms: denies Tobacco: denies Has had breast cancer but no radiation to H&N  Prior evaluation has included: labs, US , CT  History of radiation to H&N: no Family history of thyroid  cancer: no  H&N Surgery: denies Personal or FHx of bleeding dz or anesthesia difficulty: no   GLP-1: no AP/AC: no  PMHx: HTN, Breast Ca, Anxiety, MDD, CKD  Independent Review of Additional Tests or Records:  Shelbie Dess (04/26/2023) and Dr. Allene Ivan (04/2023) Referral notes reviewed and uploaded or available in chart in media tab - Endo Referral notes reviewed and uploaded or available in chart in media tab - right neck lymphadenopathy noted for about 6 weeks, concern for thyroid  cancer; CT done TSH (04/2023): wnl CT Neck 04/11/2023): independently interpreted: large right thyroid  nodule (ETE concern?) with large right neck mass; otherwise scattered right level 3 node, no contralateral LAD noted or mediastinal LAD.  Thyroid  US  (05/08/2023): noted right thyroid  lobe is quite large with internal calcifications - appears to be essentially replaced by nodule?; left thyroid  lobe subcentimeter nodules Right  trans:  Left trans nodule:  Right neck:  PMH/Meds/All/SocHx/FamHx/ROS:   Past Medical History:  Diagnosis Date   Breast cancer (HCC) 02/2000   Rt   Personal history of radiation therapy      Past Surgical History:  Procedure Laterality Date   BREAST LUMPECTOMY Right 02/2000    History reviewed. No pertinent family history.   Social Connections: Not on file      Current Outpatient Medications:    cholecalciferol (VITAMIN D3) 25 MCG (1000 UNIT) tablet, 1 tablet, Disp: , Rfl:    citalopram (CELEXA) 20 MG tablet, , Disp: , Rfl:    fluticasone (FLONASE) 50 MCG/ACT nasal spray, 1 spray in each nostril, Disp: , Rfl:    lisinopril-hydrochlorothiazide (PRINZIDE,ZESTORETIC) 20-12.5 MG tablet, , Disp: , Rfl:    metoprolol tartrate (LOPRESSOR) 100 MG tablet, , Disp: , Rfl:    NONFORMULARY OR COMPOUNDED ITEM, Antifungal solution: Terbinafine 3%, Fluconazole 2%, Tea Tree Oil 5%, Urea 10%, Ibuprofen 2% in DMSO suspension #30mL, Disp: 1 each, Rfl: 3   simvastatin (ZOCOR) 40 MG tablet, 1 tablet, Disp: , Rfl:    terbinafine (LAMISIL AT) 1 % cream, 1 application, Disp: , Rfl:    meloxicam (MOBIC) 7.5 MG tablet, 1 tablet (Patient not taking: Reported on 06/11/2023), Disp: , Rfl:    Physical Exam:   BP 119/76 (BP Location: Left Arm, Patient Position: Sitting, Cuff Size: Large)   Pulse 60   Ht 5\' 3"  (1.6 m)   Wt 150 lb (68 kg)   SpO2 94%   BMI 26.57 kg/m   Salient findings:  CN II-XII intact  Bilateral EAC clear and TM intact with well pneumatized middle ear  spaces Anterior rhinoscopy: Septum intact; bilateral inferior turbinates without significant hypertrophy No lesions of oral cavity/oropharynx; edentulous Right right neck mass level 2/3 and going up close to mastoid tip - appears mobile. In addition, there is a right thyroid  lobe mass - this does NOT appear to be soft, but rather question some fixation to trachea or muscles No respiratory distress or stridor; noted vocal tremor; TFL  was indicated to better evaluate the proximal airway, given the patient's history and exam findings, and is detailed below.  Seprately Identifiable Procedures:  Prior to initiating any procedures, risks/benefits/alternatives were explained to the patient and verbal consent obtained. Procedure Note Pre-procedure diagnosis:  Dysphonia, thyroid  and neck mass Post-procedure diagnosis: Same Procedure: Transnasal Fiberoptic Laryngoscopy, CPT 31575 - Mod 25 Indication: see above Complications: None apparent EBL: 0 mL  The procedure was undertaken to further evaluate the patient's complaint above, with mirror exam inadequate for appropriate examination due to gag reflex and poor patient tolerance  Procedure:  Patient was identified as correct patient. Verbal consent was obtained. The nose was sprayed with oxymetazoline and 4% lidocaine. The The flexible laryngoscope was passed through the nose to view the nasal cavity, pharynx (oropharynx, hypopharynx) and larynx.  The larynx was examined at rest and during multiple phonatory tasks. Documentation was obtained and reviewed with patient. The scope was removed. The patient tolerated the procedure well.  Findings: The nasal cavity and nasopharynx did not reveal any masses or lesions, mucosa appeared to be without obvious lesions. The tongue base, pharyngeal walls, piriform sinuses, vallecula, epiglottis and postcricoid region are normal in appearance. The visualized portion of the subglottis and proximal trachea is widely patent. The vocal folds are mobile bilaterally. There are no lesions on the free edge of the vocal folds nor elsewhere in the larynx worrisome for malignancy.  Airway appears patent and not deviated    Electronically signed by: Evelina Hippo, MD 06/11/2023 1:27 PM   Impression & Plans:  Whitney Miller is a 83 y.o. female with:  1. Thyroid  nodule   2. Mass of right side of neck    DDX includes thyroid  ca v/s lymphoma v/s mucosal  malignancy, thought based on exam, thyroid  appears most likely Question ETE based on imaging and how it feels. She needs workup -- biopsy Will bx thyroid  nodule on right and neck mass.  F/u in 3 weeks, sooner if necessary  See below regarding exact medications prescribed this encounter including dosages and route: No orders of the defined types were placed in this encounter.     Thank you for allowing me the opportunity to care for your patient. Please do not hesitate to contact me should you have any other questions.  Sincerely, Milon Aloe, MD Otolaryngologist (ENT), [facility] Phone: [phone] Fax: [phone]  06/11/2023, 1:27 PM   MDM:  Level 4 - 99204 Complexity/Problems addressed: mod - new problem, unknown prognosis and need for diagnosis Data complexity: high - independent review of notes, labs, independent CT and US  interpretation - Morbidity: mod

## 2023-06-14 NOTE — Progress Notes (Signed)
 Art Largo, MD  Nolia Baumgartner, MD I spoke w Dr Lydia Sams. OK for FNA, not core Bx  JM, MD       Previous Messages    ----- Message ----- From: Broc Caspers Sent: 06/14/2023   3:00 PM EDT To: [name], MD; Art Largo, MD; Mel* Subject: RE: US  core biopsy ( lymph nodes) and US  FNA*  Dr. Marne Sings replied to this review first and was pending instructions --- does this work Dr.McCullough ?   Just want to make sure I am scheduling correctly . ----- Message ----- From: Art Largo, MD Sent: 06/14/2023   2:46 PM EDT To: Edmon Magid Subject: RE: US  core biopsy ( lymph nodes) and US  FNA*  PROCEDURE / BIOPSY REVIEW Date: 06/14/23  Requested Biopsy site: R neck Reason for request: mass Imaging review: Best seen on CT neck and US  Thyroid   Decision: Approved Imaging modality to perform: Ultrasound Schedule with: No sedation / Local anesthetic Schedule for: Any VIR  Additional comments: @VIR : 4 cm R thyroid  nodule. Spoke w referrer. OK for FNA @Schedulers . US  R thyroid  nodule FNA. Local.  Please contact me with questions, concerns, or if issue pertaining to this request arise.  Art Largo, MD [facility] ----- Message ----- From: Saben Donigan Sent: 06/11/2023   1:36 PM EDT To: Oral Hallgren; Ir Procedure Requests Subject: US  core biopsy ( lymph nodes) and US  FNA BX *  Procedure : US  core biopsy ( lymph nodes) and  US  FNA BX THYROID  1ST LESION AFIRMA   Reason : right thyroid  nodule - request biopsy - request thyroglobulin washout and assess for lymphoma Dx: Thyroid  nodule [E04.1 (ICD-10-CM)]  RIGHT neck mass, do not aspirate but request core bx -- concern for thyroid  cancer - request thyroglobulin washout Dx: Mass of right side of neck [R22.1 (ICD-10-CM)]    History : US  thyroid  , CT Soft tissue neck w/  Provider : Evelina Hippo, MD  Provider contact :  [phone]

## 2023-06-17 ENCOUNTER — Encounter: Payer: Self-pay | Admitting: *Deleted

## 2023-06-17 NOTE — Progress Notes (Unsigned)
 [name], MD  Xayasine, Melissa; Mugweru, Jon, MD The lab never got back to me about whether the thyroglobulin washout test can be offered.  Certainly the cytology part if fine.    HKM       Previous Messages    ----- Message ----- From: Xayasine, Melissa Sent: 06/14/2023   3:00 PM EDT To: [name], MD; Art Largo, MD; Mel* Subject: RE: US  core biopsy ( lymph nodes) and US  FNA*  Dr. Marne Sings replied to this review first and was pending instructions --- does this work Dr.McCullough ?   Just want to make sure I am scheduling correctly . ----- Message ----- From: Art Largo, MD Sent: 06/14/2023   2:46 PM EDT To: [name] Subject: RE: US  core biopsy ( lymph nodes) and US  FNA*  PROCEDURE / BIOPSY REVIEW Date: 06/14/23  Requested Biopsy site: R neck Reason for request: mass Imaging review: Best seen on CT neck and US  Thyroid   Decision: Approved Imaging modality to perform: Ultrasound Schedule with: No sedation / Local anesthetic Schedule for: Any VIR  Additional comments: @VIR : 4 cm R thyroid  nodule. Spoke w referrer. OK for FNA @Schedulers . US  R thyroid  nodule FNA. Local.  Please contact me with questions, concerns, or if issue pertaining to this request arise.  Art Largo, MD [facility] ----- Message ----- From: Xayasine, Melissa Sent: 06/11/2023   1:36 PM EDT To: Melissa Xayasine; Ir Procedure Requests Subject: US  core biopsy ( lymph nodes) and US  FNA BX *  Procedure : US  core biopsy ( lymph nodes) and  US  FNA BX THYROID  1ST LESION AFIRMA   Reason : right thyroid  nodule - request biopsy - request thyroglobulin washout and assess for lymphoma Dx: Thyroid  nodule [E04.1 (ICD-10-CM)]  RIGHT neck mass, do not aspirate but request core bx -- concern for thyroid  cancer - request thyroglobulin washout Dx: Mass of right side of neck [R22.1 (ICD-10-CM)]    History : US  thyroid   , CT Soft tissue neck w/  Provider : Evelina Hippo, MD  Provider contact : [phone]

## 2023-06-17 NOTE — Progress Notes (Unsigned)
 [name], MD  Xayasine, Melissa; Mugweru, Jon, MD The lab never got back to me about whether the thyroglobulin washout test can be offered.  Certainly the cytology part if fine.    HKM

## 2023-06-25 ENCOUNTER — Telehealth (INDEPENDENT_AMBULATORY_CARE_PROVIDER_SITE_OTHER): Payer: Self-pay

## 2023-06-25 NOTE — Telephone Encounter (Signed)
 Received a VM from Imaging stating there is not a right nodule and would like to know if a right random lobe is fine to proceed with. Spoke to provider and was informed that the right random lob is okay to proceed with. Spoke to imaging and informed them to proceed. Provider would like it expedited.

## 2023-07-01 DIAGNOSIS — E785 Hyperlipidemia, unspecified: Secondary | ICD-10-CM | POA: Diagnosis not present

## 2023-07-01 DIAGNOSIS — Z853 Personal history of malignant neoplasm of breast: Secondary | ICD-10-CM | POA: Diagnosis not present

## 2023-07-01 DIAGNOSIS — I10 Essential (primary) hypertension: Secondary | ICD-10-CM | POA: Diagnosis not present

## 2023-07-01 DIAGNOSIS — N1831 Chronic kidney disease, stage 3a: Secondary | ICD-10-CM | POA: Diagnosis not present

## 2023-07-04 ENCOUNTER — Ambulatory Visit (INDEPENDENT_AMBULATORY_CARE_PROVIDER_SITE_OTHER): Admitting: Otolaryngology

## 2023-07-09 DIAGNOSIS — I10 Essential (primary) hypertension: Secondary | ICD-10-CM | POA: Diagnosis not present

## 2023-07-09 DIAGNOSIS — R21 Rash and other nonspecific skin eruption: Secondary | ICD-10-CM | POA: Diagnosis not present

## 2023-07-09 DIAGNOSIS — R221 Localized swelling, mass and lump, neck: Secondary | ICD-10-CM | POA: Diagnosis not present

## 2023-07-16 ENCOUNTER — Telehealth (INDEPENDENT_AMBULATORY_CARE_PROVIDER_SITE_OTHER): Payer: Self-pay | Admitting: Otolaryngology

## 2023-07-16 NOTE — Telephone Encounter (Signed)
 Brandy from [facility] called and has a question regarding the order for the thyroid  biopsy Dr. Tobie ordered.  Please call her at [phone].

## 2023-07-19 ENCOUNTER — Ambulatory Visit
Admission: RE | Admit: 2023-07-19 | Discharge: 2023-07-19 | Disposition: A | Discharge status: Home / Self Care | Source: Ambulatory Visit | Attending: Otolaryngology | Admitting: Otolaryngology

## 2023-07-19 ENCOUNTER — Other Ambulatory Visit (HOSPITAL_COMMUNITY)
Admission: RE | Admit: 2023-07-19 | Discharge: 2023-07-19 | Disposition: A | Discharge status: Home / Self Care | Source: Ambulatory Visit | Attending: Student | Admitting: Student

## 2023-07-19 DIAGNOSIS — E041 Nontoxic single thyroid nodule: Secondary | ICD-10-CM | POA: Insufficient documentation

## 2023-07-19 DIAGNOSIS — E042 Nontoxic multinodular goiter: Secondary | ICD-10-CM | POA: Diagnosis not present

## 2023-07-19 DIAGNOSIS — C73 Malignant neoplasm of thyroid gland: Secondary | ICD-10-CM | POA: Diagnosis not present

## 2023-07-23 LAB — CYTOLOGY - NON PAP

## 2023-07-29 DIAGNOSIS — R221 Localized swelling, mass and lump, neck: Secondary | ICD-10-CM | POA: Diagnosis not present

## 2023-07-29 DIAGNOSIS — L259 Unspecified contact dermatitis, unspecified cause: Secondary | ICD-10-CM | POA: Diagnosis not present

## 2023-07-29 DIAGNOSIS — L810 Postinflammatory hyperpigmentation: Secondary | ICD-10-CM | POA: Diagnosis not present

## 2023-08-01 DIAGNOSIS — Z853 Personal history of malignant neoplasm of breast: Secondary | ICD-10-CM | POA: Diagnosis not present

## 2023-08-01 DIAGNOSIS — I10 Essential (primary) hypertension: Secondary | ICD-10-CM | POA: Diagnosis not present

## 2023-08-01 DIAGNOSIS — N1831 Chronic kidney disease, stage 3a: Secondary | ICD-10-CM | POA: Diagnosis not present

## 2023-08-01 DIAGNOSIS — E785 Hyperlipidemia, unspecified: Secondary | ICD-10-CM | POA: Diagnosis not present

## 2023-08-13 ENCOUNTER — Ambulatory Visit (INDEPENDENT_AMBULATORY_CARE_PROVIDER_SITE_OTHER): Admitting: Otolaryngology

## 2023-08-13 ENCOUNTER — Encounter (INDEPENDENT_AMBULATORY_CARE_PROVIDER_SITE_OTHER): Payer: Self-pay | Admitting: Otolaryngology

## 2023-08-13 VITALS — BP 133/74 | HR 64 | Ht 63.0 in | Wt 150.0 lb

## 2023-08-13 DIAGNOSIS — R221 Localized swelling, mass and lump, neck: Secondary | ICD-10-CM

## 2023-08-13 DIAGNOSIS — C73 Malignant neoplasm of thyroid gland: Secondary | ICD-10-CM | POA: Diagnosis not present

## 2023-08-13 NOTE — Progress Notes (Signed)
 Dear Dr. Elliot, Here is my assessment for our mutual patient, Whitney Miller. Thank you for allowing me the opportunity to care for your patient. Please do not hesitate to contact me should you have any other questions. Sincerely, Dr. Eldora Blanch  Otolaryngology Clinic Note Referring provider: Dr. Elliot HPI:  Whitney Miller is a 83 y.o. female kindly referred by Dr. Varadarajan for evaluation of neck mass  Initial visit (06/2023):  First noted right neck mass about 4 months ago, incidentally. She has not noted thyroid  mass. Seems like getting larger, sometimes difficult to turn neck to left as a result. Not painful, no trouble swallowing or breathing, but reports voice is more tremulous. No prior skin cancers, denies tobacco use.   Compressive symptoms: see above Hypo or hyperthyroid symptoms: denies Tobacco: denies Has had breast cancer but no radiation to H&N  --------------------------------------------------------- 08/13/2023 Seen in follow up. It has taken a while for her to get her biopsy due to concern from radiology that there was no distinct nodule to biopsy. Otherwise she reports that she is doing ok. No pain, no trouble swallowing or breathing. We discussed her biopsy results.   Prior evaluation has included: labs, US , CT, FNA  History of radiation to H&N: no Family history of thyroid  cancer: no  H&N Surgery: denies Personal or FHx of bleeding dz or anesthesia difficulty: no   GLP-1: no AP/AC: no  PMHx: HTN, Breast Ca, Anxiety, MDD, CKD  Independent Review of Additional Tests or Records:  Duwaine Clover (04/26/2023) and Dr. Clive (04/2023) Referral notes reviewed and uploaded or available in chart in media tab - Endo Referral notes reviewed and uploaded or available in chart in media tab - right neck lymphadenopathy noted for about 6 weeks, concern for thyroid  cancer; CT done TSH (04/2023): wnl CT Neck 04/11/2023): independently interpreted: large right  thyroid  nodule (ETE concern?) with large right neck mass; otherwise scattered right level 3 node, no contralateral LAD noted or mediastinal LAD.  Thyroid  US  (05/08/2023): noted right thyroid  lobe is quite large with internal calcifications - appears to be essentially replaced by nodule?; left thyroid  lobe subcentimeter nodules Right trans:  Left trans nodule:  Right neck:   FNA 07/2023:   PMH/Meds/All/SocHx/FamHx/ROS:   Past Medical History:  Diagnosis Date   Breast cancer (HCC) 02/2000   Rt   Personal history of radiation therapy      Past Surgical History:  Procedure Laterality Date   BREAST LUMPECTOMY Right 02/2000    History reviewed. No pertinent family history.   Social Connections: Not on file      Current Outpatient Medications:    cholecalciferol (VITAMIN D3) 25 MCG (1000 UNIT) tablet, 1 tablet, Disp: , Rfl:    citalopram (CELEXA) 20 MG tablet, , Disp: , Rfl:    fluticasone (FLONASE) 50 MCG/ACT nasal spray, 1 spray in each nostril, Disp: , Rfl:    lisinopril-hydrochlorothiazide (PRINZIDE,ZESTORETIC) 20-12.5 MG tablet, , Disp: , Rfl:    metoprolol tartrate (LOPRESSOR) 100 MG tablet, , Disp: , Rfl:    NONFORMULARY OR COMPOUNDED ITEM, Antifungal solution: Terbinafine 3%, Fluconazole 2%, Tea Tree Oil 5%, Urea 10%, Ibuprofen 2% in DMSO suspension #30mL, Disp: 1 each, Rfl: 3   simvastatin (ZOCOR) 40 MG tablet, 1 tablet, Disp: , Rfl:    terbinafine (LAMISIL AT) 1 % cream, 1 application, Disp: , Rfl:    meloxicam (MOBIC) 7.5 MG tablet, 1 tablet (Patient not taking: Reported on 08/13/2023), Disp: , Rfl:    Physical Exam:   BP  133/74 (BP Location: Left Arm, Patient Position: Sitting, Cuff Size: Normal)   Pulse 64   Ht 5' 3 (1.6 m)   Wt 150 lb (68 kg)   SpO2 93%   BMI 26.57 kg/m   Salient findings:  CN II-XII intact Anterior rhinoscopy: Septum intact; bilateral inferior turbinates without significant hypertrophy No lesions of oral cavity/oropharynx Right right neck  mass level 2/3 and going up close to mastoid tip - appears mobile. In addition, there is a right thyroid  lobe mass - this does NOT appear to be soft, but quite firm No respiratory distress or stridor; noted vocal tremor;   Seprately Identifiable Procedures:  Prior to initiating any procedures, risks/benefits/alternatives were explained to the patient and verbal consent obtained. Procedure Note (prior, not today) Pre-procedure diagnosis:  Dysphonia, thyroid  and neck mass Post-procedure diagnosis: Same Procedure: Transnasal Fiberoptic Laryngoscopy, CPT 31575 - Mod 25 Indication: see above Complications: None apparent EBL: 0 mL  The procedure was undertaken to further evaluate the patient's complaint above, with mirror exam inadequate for appropriate examination due to gag reflex and poor patient tolerance  Procedure:  Patient was identified as correct patient. Verbal consent was obtained. The nose was sprayed with oxymetazoline and 4% lidocaine. The The flexible laryngoscope was passed through the nose to view the nasal cavity, pharynx (oropharynx, hypopharynx) and larynx.  The larynx was examined at rest and during multiple phonatory tasks. Documentation was obtained and reviewed with patient. The scope was removed. The patient tolerated the procedure well.  Findings: The nasal cavity and nasopharynx did not reveal any masses or lesions, mucosa appeared to be without obvious lesions. The tongue base, pharyngeal walls, piriform sinuses, vallecula, epiglottis and postcricoid region are normal in appearance. The visualized portion of the subglottis and proximal trachea is widely patent. The vocal folds are mobile bilaterally. There are no lesions on the free edge of the vocal folds nor elsewhere in the larynx worrisome for malignancy.  Airway appears patent and not deviated    Electronically signed by: Eldora KATHEE Blanch, MD 08/13/2023 1:33 PM   Impression & Plans:  Whitney Miller is a 83 y.o.  female with:  1. Thyroid  cancer (HCC)   2. 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 Given firmness, I do worry about some extrathyroidal extension. As such, will refer to [facility] for resection and definitive treatment She was advised to call if she does not hear back from them in 1 week  See below regarding exact medications prescribed this encounter including dosages and route: No orders of the defined types were placed in this encounter.     Thank you for allowing me the opportunity to care for your patient. Please do not hesitate to contact me should you have any other questions.  Sincerely, Eldora Blanch, MD Otolaryngologist (ENT), [facility] Phone: [phone] Fax: [phone]  08/13/2023, 1:33 PM   I have personally spent 42 minutes involved in face-to-face and non-face-to-face activities for this patient on the day of the visit.  Professional time spent excludes any procedures performed but includes the following activities, in addition to those noted in the documentation: preparing to see the patient (review of outside documentation and results), performing a medically appropriate examination, extensive counseling, documenting in the electronic health record

## 2023-08-15 ENCOUNTER — Encounter (INDEPENDENT_AMBULATORY_CARE_PROVIDER_SITE_OTHER): Payer: Self-pay

## 2023-08-21 DIAGNOSIS — C73 Malignant neoplasm of thyroid gland: Secondary | ICD-10-CM | POA: Diagnosis not present

## 2023-08-26 DIAGNOSIS — C73 Malignant neoplasm of thyroid gland: Secondary | ICD-10-CM | POA: Diagnosis not present

## 2023-08-26 DIAGNOSIS — L309 Dermatitis, unspecified: Secondary | ICD-10-CM | POA: Diagnosis not present

## 2023-08-26 DIAGNOSIS — F3341 Major depressive disorder, recurrent, in partial remission: Secondary | ICD-10-CM | POA: Diagnosis not present

## 2023-08-26 DIAGNOSIS — E785 Hyperlipidemia, unspecified: Secondary | ICD-10-CM | POA: Diagnosis not present

## 2023-08-26 DIAGNOSIS — N1831 Chronic kidney disease, stage 3a: Secondary | ICD-10-CM | POA: Diagnosis not present

## 2023-08-26 DIAGNOSIS — Z853 Personal history of malignant neoplasm of breast: Secondary | ICD-10-CM | POA: Diagnosis not present

## 2023-08-26 DIAGNOSIS — Z0000 Encounter for general adult medical examination without abnormal findings: Secondary | ICD-10-CM | POA: Diagnosis not present

## 2023-08-26 DIAGNOSIS — I10 Essential (primary) hypertension: Secondary | ICD-10-CM | POA: Diagnosis not present

## 2023-08-26 DIAGNOSIS — Z23 Encounter for immunization: Secondary | ICD-10-CM | POA: Diagnosis not present

## 2023-08-26 DIAGNOSIS — Z1331 Encounter for screening for depression: Secondary | ICD-10-CM | POA: Diagnosis not present

## 2023-09-01 DIAGNOSIS — I10 Essential (primary) hypertension: Secondary | ICD-10-CM | POA: Diagnosis not present

## 2023-09-01 DIAGNOSIS — Z853 Personal history of malignant neoplasm of breast: Secondary | ICD-10-CM | POA: Diagnosis not present

## 2023-09-01 DIAGNOSIS — E785 Hyperlipidemia, unspecified: Secondary | ICD-10-CM | POA: Diagnosis not present

## 2023-09-01 DIAGNOSIS — N1831 Chronic kidney disease, stage 3a: Secondary | ICD-10-CM | POA: Diagnosis not present

## 2023-09-20 DIAGNOSIS — C73 Malignant neoplasm of thyroid gland: Secondary | ICD-10-CM | POA: Diagnosis not present

## 2023-09-20 DIAGNOSIS — I10 Essential (primary) hypertension: Secondary | ICD-10-CM | POA: Diagnosis not present

## 2023-09-20 DIAGNOSIS — F418 Other specified anxiety disorders: Secondary | ICD-10-CM | POA: Diagnosis not present

## 2023-09-20 DIAGNOSIS — E7849 Other hyperlipidemia: Secondary | ICD-10-CM | POA: Diagnosis not present

## 2023-09-20 DIAGNOSIS — N183 Chronic kidney disease, stage 3 unspecified: Secondary | ICD-10-CM | POA: Diagnosis not present

## 2023-09-20 DIAGNOSIS — Z01818 Encounter for other preprocedural examination: Secondary | ICD-10-CM | POA: Diagnosis not present

## 2023-09-22 DIAGNOSIS — R001 Bradycardia, unspecified: Secondary | ICD-10-CM | POA: Diagnosis not present

## 2023-09-23 IMAGING — MG MM DIGITAL SCREENING BILAT W/ TOMO AND CAD
6 of 10 series · 6 of 30 positions shown · non-contrast
Comparison: Previous exam(s).

CLINICAL DATA: Screening. RIGHT lumpectomy 3883

EXAM:
DIGITAL SCREENING BILATERAL MAMMOGRAM WITH TOMOSYNTHESIS AND CAD
TECHNIQUE: Bilateral screening digital craniocaudal and mediolateral oblique
mammograms were obtained. Bilateral screening digital breast
tomosynthesis was performed. The images were evaluated with
computer-aided detection.

[L CC synth-2D]
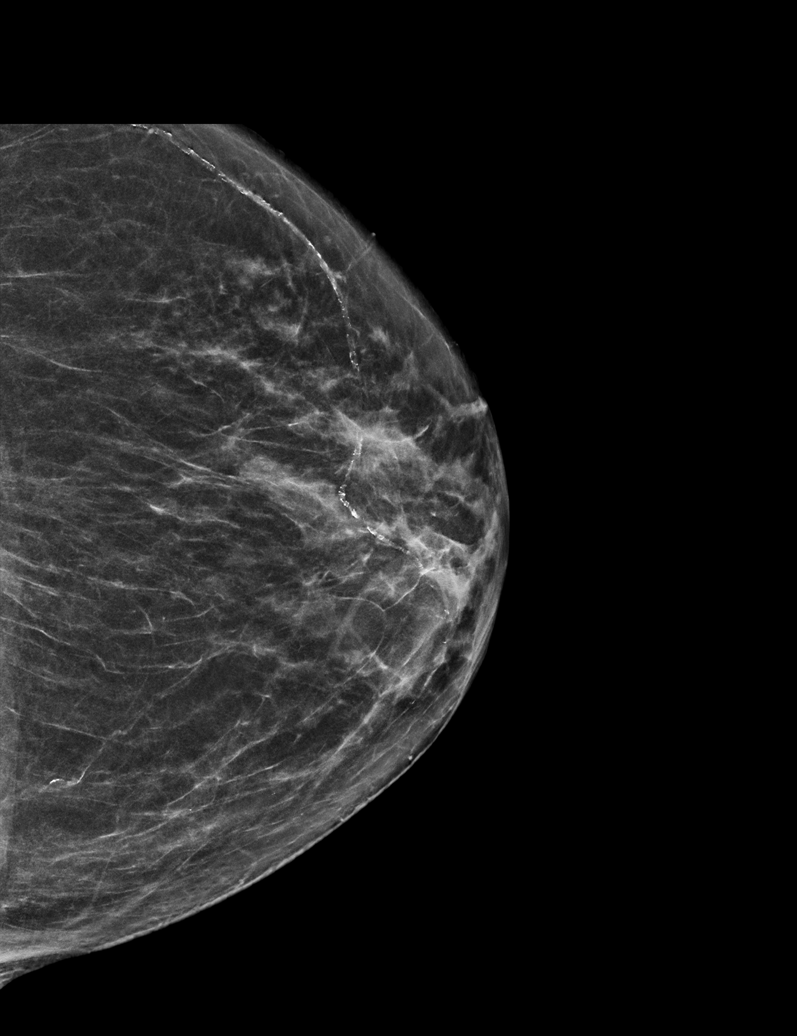

[R MLO synth-2D (1 of 2)]
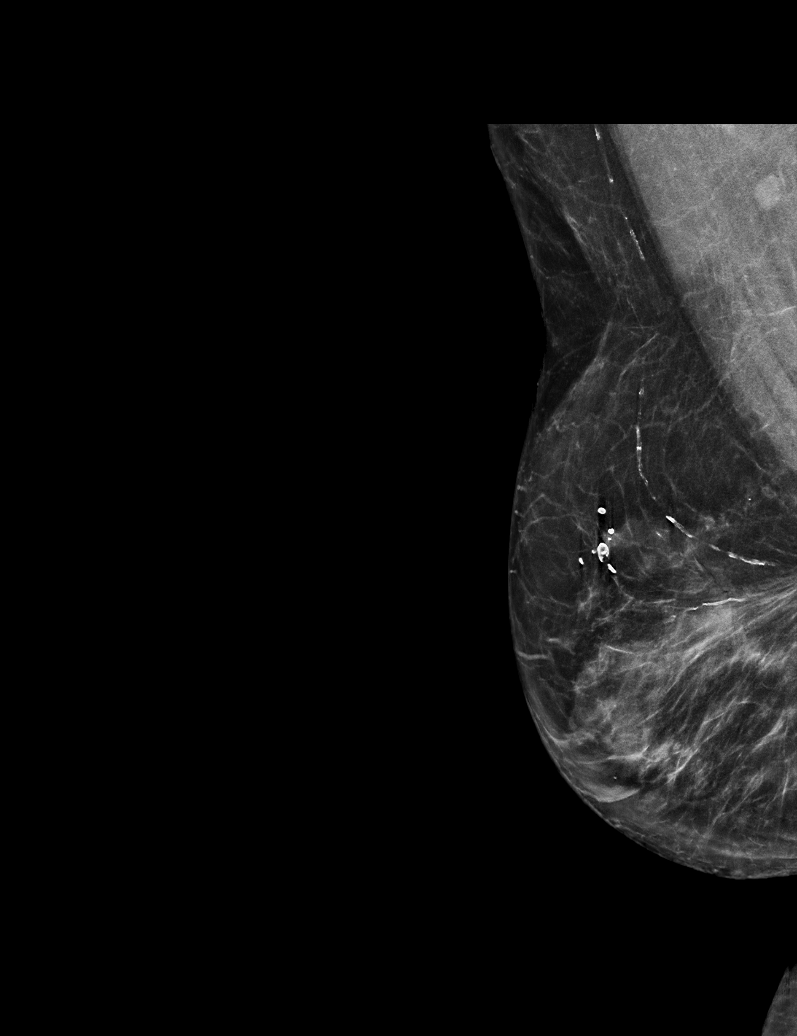

[L MLO synth-2D]
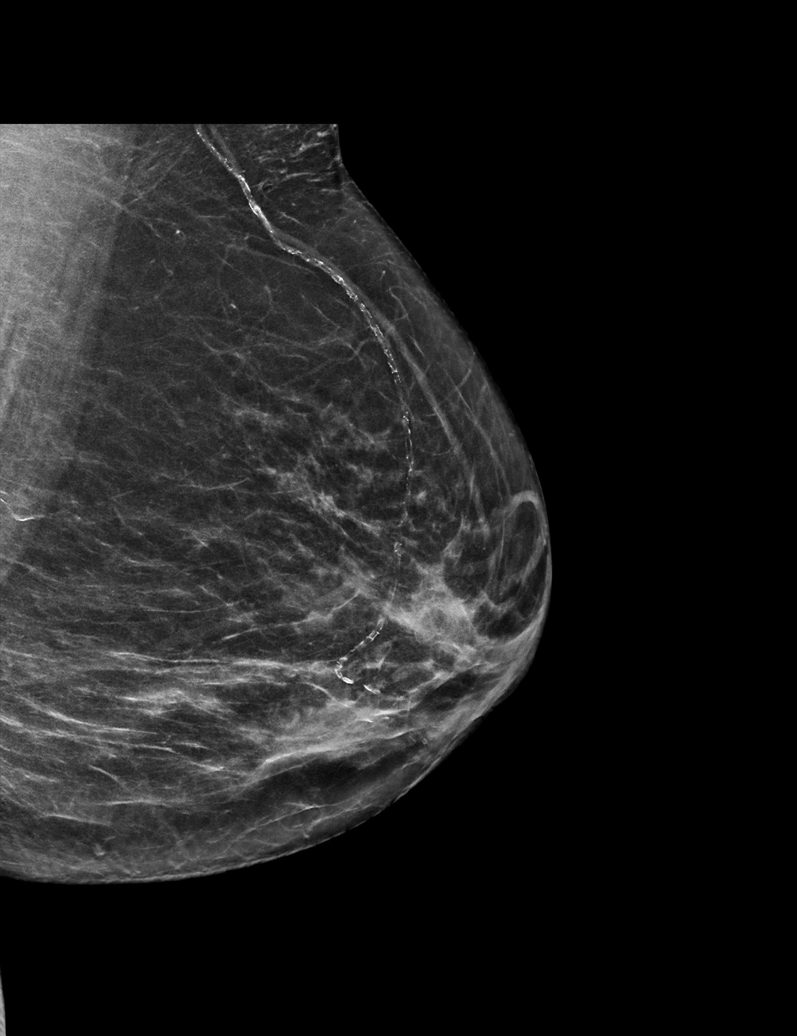

[R CC synth-2D]
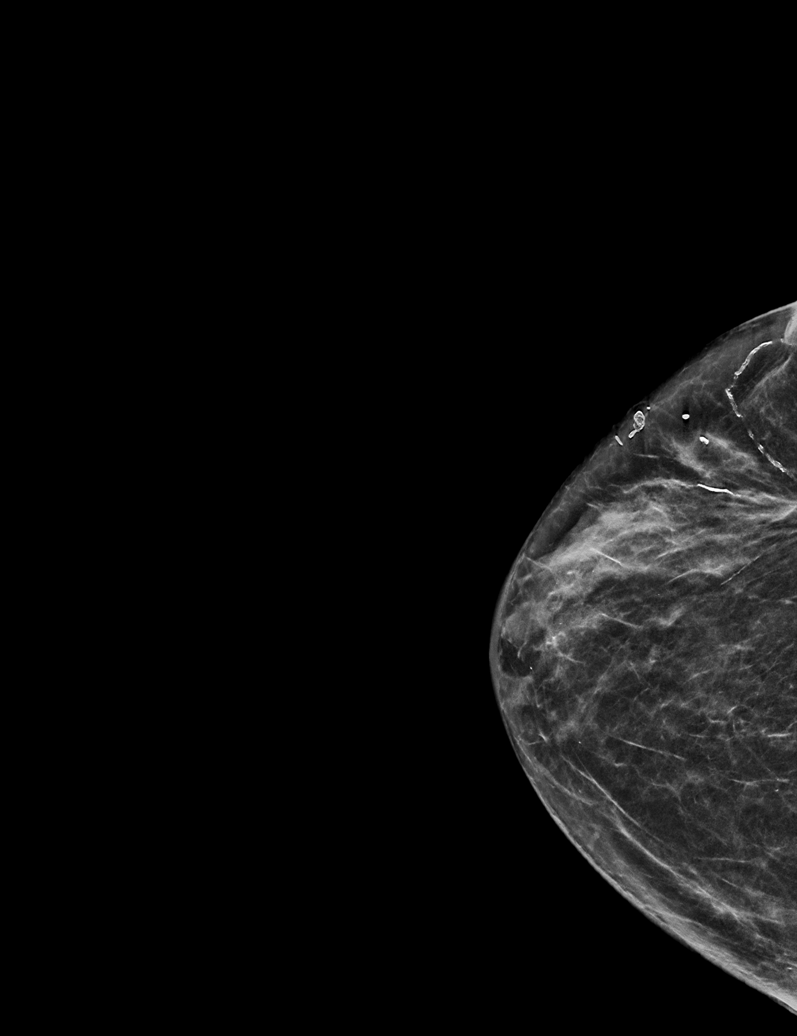

[R MLO synth-2D (2 of 2)]
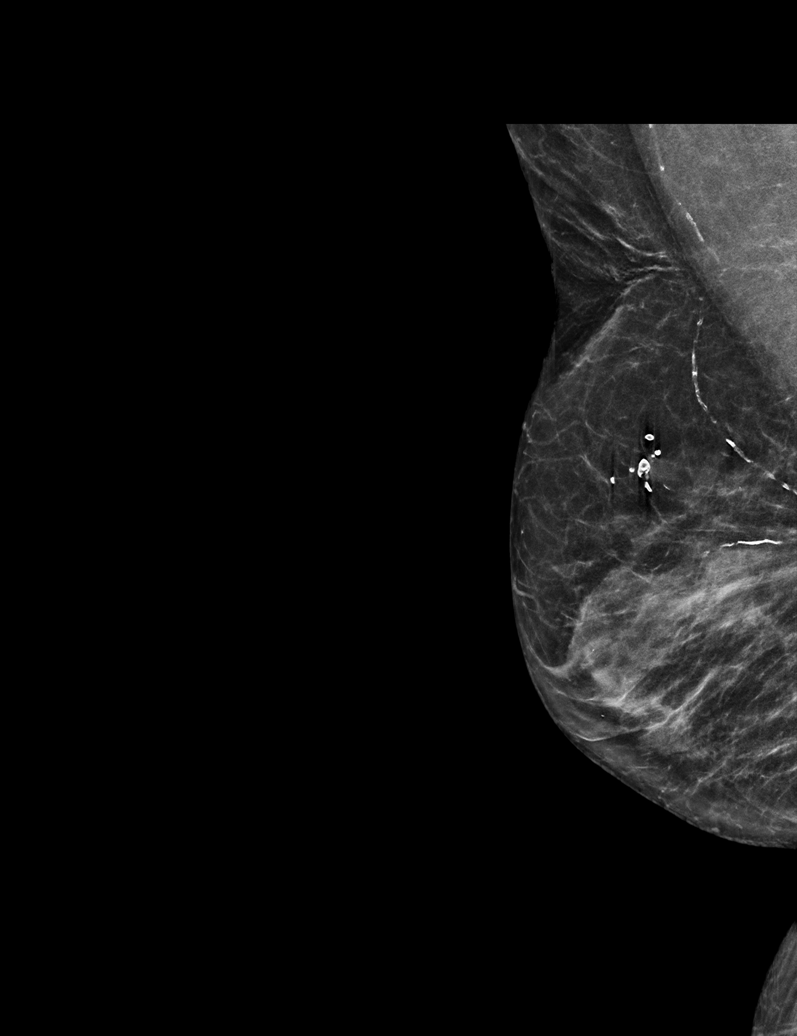

[R CC tomo · tomo slice 24/47.0]
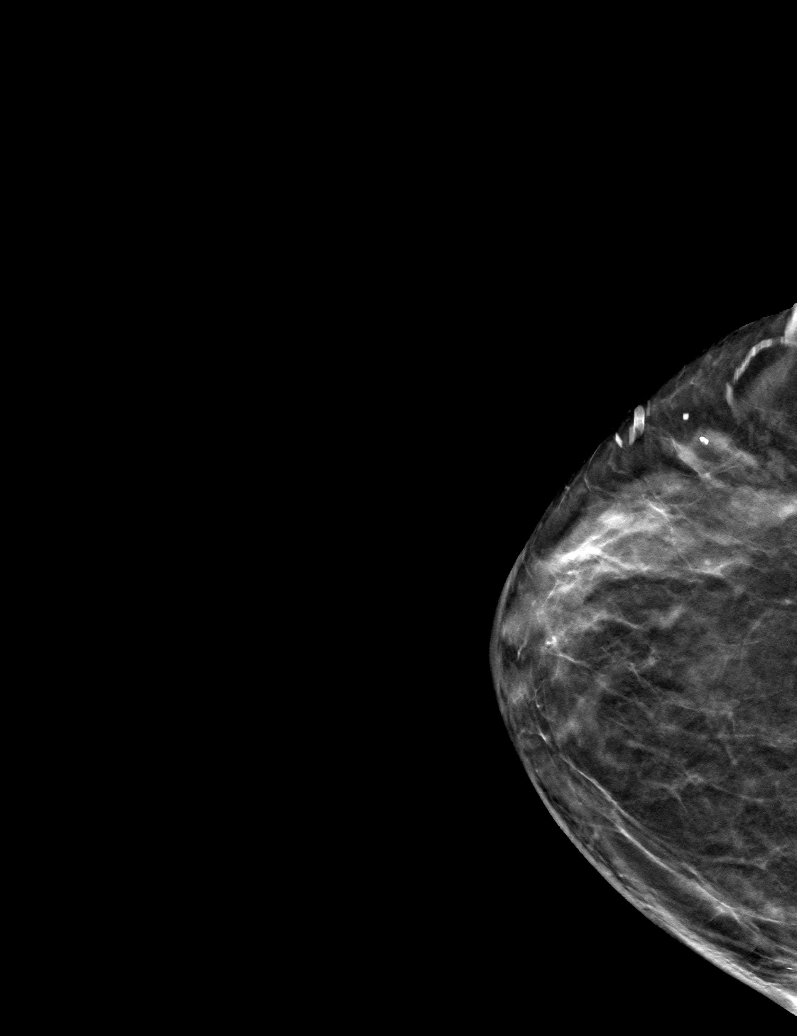

[6 of 30 positions shown; findings below may reference images not displayed]

ACR Breast Density Category b: There are scattered areas of
fibroglandular density.
FINDINGS: There are no findings suspicious for malignancy. There is density
and architectural distortion within the RIGHT breast, consistent
with postsurgical changes. These are stable in comparison to prior.
IMPRESSION: No mammographic evidence of malignancy. A result letter of this
screening mammogram will be mailed directly to the patient.

RECOMMENDATION:
Screening mammogram in one year. (Code:MA-Y-D62)

BI-RADS CATEGORY  2: Benign.

## 2023-10-01 DIAGNOSIS — Z853 Personal history of malignant neoplasm of breast: Secondary | ICD-10-CM | POA: Diagnosis not present

## 2023-10-01 DIAGNOSIS — E785 Hyperlipidemia, unspecified: Secondary | ICD-10-CM | POA: Diagnosis not present

## 2023-10-01 DIAGNOSIS — N1831 Chronic kidney disease, stage 3a: Secondary | ICD-10-CM | POA: Diagnosis not present

## 2023-10-01 DIAGNOSIS — I10 Essential (primary) hypertension: Secondary | ICD-10-CM | POA: Diagnosis not present

## 2023-10-04 DIAGNOSIS — C73 Malignant neoplasm of thyroid gland: Secondary | ICD-10-CM | POA: Diagnosis not present

## 2023-10-04 DIAGNOSIS — R1310 Dysphagia, unspecified: Secondary | ICD-10-CM | POA: Diagnosis not present

## 2023-10-04 DIAGNOSIS — Z923 Personal history of irradiation: Secondary | ICD-10-CM | POA: Diagnosis not present

## 2023-10-04 DIAGNOSIS — E785 Hyperlipidemia, unspecified: Secondary | ICD-10-CM | POA: Diagnosis not present

## 2023-10-04 DIAGNOSIS — I129 Hypertensive chronic kidney disease with stage 1 through stage 4 chronic kidney disease, or unspecified chronic kidney disease: Secondary | ICD-10-CM | POA: Diagnosis not present

## 2023-10-04 DIAGNOSIS — D62 Acute posthemorrhagic anemia: Secondary | ICD-10-CM | POA: Diagnosis not present

## 2023-10-04 DIAGNOSIS — Z853 Personal history of malignant neoplasm of breast: Secondary | ICD-10-CM | POA: Diagnosis not present

## 2023-10-04 DIAGNOSIS — F419 Anxiety disorder, unspecified: Secondary | ICD-10-CM | POA: Diagnosis not present

## 2023-10-04 DIAGNOSIS — N183 Chronic kidney disease, stage 3 unspecified: Secondary | ICD-10-CM | POA: Diagnosis not present

## 2023-10-04 DIAGNOSIS — K5900 Constipation, unspecified: Secondary | ICD-10-CM | POA: Diagnosis not present

## 2023-10-04 DIAGNOSIS — J3802 Paralysis of vocal cords and larynx, bilateral: Secondary | ICD-10-CM | POA: Diagnosis not present

## 2023-10-04 DIAGNOSIS — C770 Secondary and unspecified malignant neoplasm of lymph nodes of head, face and neck: Secondary | ICD-10-CM | POA: Diagnosis not present

## 2023-10-05 DIAGNOSIS — Z4659 Encounter for fitting and adjustment of other gastrointestinal appliance and device: Secondary | ICD-10-CM | POA: Diagnosis not present

## 2023-10-05 DIAGNOSIS — C73 Malignant neoplasm of thyroid gland: Secondary | ICD-10-CM | POA: Diagnosis not present

## 2023-10-09 DIAGNOSIS — R1311 Dysphagia, oral phase: Secondary | ICD-10-CM | POA: Diagnosis not present

## 2023-10-09 DIAGNOSIS — C73 Malignant neoplasm of thyroid gland: Secondary | ICD-10-CM | POA: Diagnosis not present

## 2023-10-09 DIAGNOSIS — R633 Feeding difficulties, unspecified: Secondary | ICD-10-CM | POA: Diagnosis not present

## 2023-10-11 DIAGNOSIS — C73 Malignant neoplasm of thyroid gland: Secondary | ICD-10-CM | POA: Diagnosis not present

## 2023-11-11 NOTE — Telephone Encounter (Signed)
 Per last chart note 10/25-NM consult referral placed

## 2023-12-04 ENCOUNTER — Emergency Department (HOSPITAL_COMMUNITY)

## 2023-12-04 ENCOUNTER — Encounter (HOSPITAL_COMMUNITY): Payer: Self-pay

## 2023-12-04 ENCOUNTER — Inpatient Hospital Stay (HOSPITAL_COMMUNITY)
Admission: EM | Admit: 2023-12-04 | Discharge: 2023-12-09 | DRG: 871 | Disposition: A | Discharge status: Home Health | Attending: Internal Medicine | Admitting: Internal Medicine

## 2023-12-04 ENCOUNTER — Other Ambulatory Visit: Payer: Self-pay

## 2023-12-04 DIAGNOSIS — W19XXXA Unspecified fall, initial encounter: Secondary | ICD-10-CM | POA: Diagnosis not present

## 2023-12-04 DIAGNOSIS — E785 Hyperlipidemia, unspecified: Secondary | ICD-10-CM | POA: Diagnosis present

## 2023-12-04 DIAGNOSIS — Z79899 Other long term (current) drug therapy: Secondary | ICD-10-CM

## 2023-12-04 DIAGNOSIS — R6520 Severe sepsis without septic shock: Secondary | ICD-10-CM | POA: Diagnosis present

## 2023-12-04 DIAGNOSIS — N390 Urinary tract infection, site not specified: Secondary | ICD-10-CM | POA: Diagnosis present

## 2023-12-04 DIAGNOSIS — A419 Sepsis, unspecified organism: Principal | ICD-10-CM | POA: Diagnosis present

## 2023-12-04 DIAGNOSIS — E7849 Other hyperlipidemia: Secondary | ICD-10-CM

## 2023-12-04 DIAGNOSIS — R7989 Other specified abnormal findings of blood chemistry: Secondary | ICD-10-CM | POA: Diagnosis present

## 2023-12-04 DIAGNOSIS — Z85850 Personal history of malignant neoplasm of thyroid: Secondary | ICD-10-CM

## 2023-12-04 DIAGNOSIS — Z931 Gastrostomy status: Secondary | ICD-10-CM

## 2023-12-04 DIAGNOSIS — Y92009 Unspecified place in unspecified non-institutional (private) residence as the place of occurrence of the external cause: Secondary | ICD-10-CM

## 2023-12-04 DIAGNOSIS — I951 Orthostatic hypotension: Secondary | ICD-10-CM | POA: Diagnosis present

## 2023-12-04 DIAGNOSIS — N179 Acute kidney failure, unspecified: Principal | ICD-10-CM | POA: Diagnosis present

## 2023-12-04 DIAGNOSIS — M25552 Pain in left hip: Secondary | ICD-10-CM | POA: Diagnosis present

## 2023-12-04 DIAGNOSIS — D649 Anemia, unspecified: Secondary | ICD-10-CM | POA: Diagnosis present

## 2023-12-04 DIAGNOSIS — F411 Generalized anxiety disorder: Secondary | ICD-10-CM | POA: Diagnosis present

## 2023-12-04 DIAGNOSIS — Z923 Personal history of irradiation: Secondary | ICD-10-CM

## 2023-12-04 DIAGNOSIS — I10 Essential (primary) hypertension: Secondary | ICD-10-CM | POA: Diagnosis present

## 2023-12-04 DIAGNOSIS — Z930 Tracheostomy status: Secondary | ICD-10-CM

## 2023-12-04 DIAGNOSIS — Z6822 Body mass index (BMI) 22.0-22.9, adult: Secondary | ICD-10-CM

## 2023-12-04 DIAGNOSIS — E039 Hypothyroidism, unspecified: Secondary | ICD-10-CM | POA: Insufficient documentation

## 2023-12-04 DIAGNOSIS — Z79890 Hormone replacement therapy (postmenopausal): Secondary | ICD-10-CM

## 2023-12-04 DIAGNOSIS — W06XXXA Fall from bed, initial encounter: Secondary | ICD-10-CM | POA: Diagnosis present

## 2023-12-04 DIAGNOSIS — E43 Unspecified severe protein-calorie malnutrition: Secondary | ICD-10-CM | POA: Diagnosis present

## 2023-12-04 DIAGNOSIS — S80212A Abrasion, left knee, initial encounter: Secondary | ICD-10-CM | POA: Diagnosis present

## 2023-12-04 DIAGNOSIS — Z853 Personal history of malignant neoplasm of breast: Secondary | ICD-10-CM

## 2023-12-04 DIAGNOSIS — E890 Postprocedural hypothyroidism: Secondary | ICD-10-CM | POA: Diagnosis present

## 2023-12-04 LAB — CBC WITH DIFFERENTIAL/PLATELET
Abs Immature Granulocytes: 0.05 K/uL (ref 0.00–0.07)
Basophils Absolute: 0 K/uL (ref 0.0–0.1)
Basophils Relative: 0 %
Eosinophils Absolute: 0 K/uL (ref 0.0–0.5)
Eosinophils Relative: 0 %
HCT: 33.5 % — ABNORMAL LOW (ref 36.0–46.0)
Hemoglobin: 10.7 g/dL — ABNORMAL LOW (ref 12.0–15.0)
Immature Granulocytes: 0 %
Lymphocytes Relative: 6 %
Lymphs Abs: 0.8 K/uL (ref 0.7–4.0)
MCH: 28.3 pg (ref 26.0–34.0)
MCHC: 31.9 g/dL (ref 30.0–36.0)
MCV: 88.6 fL (ref 80.0–100.0)
Monocytes Absolute: 0.8 K/uL (ref 0.1–1.0)
Monocytes Relative: 7 %
Neutro Abs: 10.7 K/uL — ABNORMAL HIGH (ref 1.7–7.7)
Neutrophils Relative %: 87 %
Platelets: 254 K/uL (ref 150–400)
RBC: 3.78 MIL/uL — ABNORMAL LOW (ref 3.87–5.11)
RDW: 13.2 % (ref 11.5–15.5)
WBC: 12.4 K/uL — ABNORMAL HIGH (ref 4.0–10.5)
nRBC: 0 % (ref 0.0–0.2)

## 2023-12-04 LAB — CBG MONITORING, ED: Glucose-Capillary: 160 mg/dL — ABNORMAL HIGH (ref 70–99)

## 2023-12-04 LAB — BASIC METABOLIC PANEL WITH GFR
Anion gap: 10 (ref 5–15)
BUN: 91 mg/dL — ABNORMAL HIGH (ref 8–23)
CO2: 32 mmol/L (ref 22–32)
Calcium: 8.5 mg/dL — ABNORMAL LOW (ref 8.9–10.3)
Chloride: 94 mmol/L — ABNORMAL LOW (ref 98–111)
Creatinine, Ser: 2.72 mg/dL — ABNORMAL HIGH (ref 0.44–1.00)
GFR, Estimated: 17 mL/min — ABNORMAL LOW (ref >60)
Glucose, Bld: 119 mg/dL — ABNORMAL HIGH (ref 70–99)
Potassium: 3.9 mmol/L (ref 3.5–5.1)
Sodium: 136 mmol/L (ref 135–145)

## 2023-12-04 MED ORDER — NEPRO/CARBSTEADY PO LIQD
1000.0000 mL | ORAL | Status: DC
  Filled 2023-12-04: qty 1000

## 2023-12-04 MED ORDER — CALCIUM CARBONATE ANTACID 500 MG PO CHEW
1.0000 | CHEWABLE_TABLET | Freq: Every morning | ORAL | Status: DC
  Administered 2023-12-05 – 2023-12-09 (×5): 200 mg
  Filled 2023-12-04 (×5): qty 1

## 2023-12-04 MED ORDER — SIMVASTATIN 20 MG PO TABS
40.0000 mg | ORAL_TABLET | Freq: Every day | ORAL | Status: DC
  Administered 2023-12-05 – 2023-12-08 (×5): 40 mg
  Filled 2023-12-04 (×5): qty 2

## 2023-12-04 MED ORDER — SODIUM CHLORIDE 0.9 % IV BOLUS
1000.0000 mL | Freq: Once | INTRAVENOUS | Status: AC
  Administered 2023-12-04: 1000 mL via INTRAVENOUS

## 2023-12-04 MED ORDER — CITALOPRAM HYDROBROMIDE 20 MG PO TABS
20.0000 mg | ORAL_TABLET | Freq: Every day | ORAL | Status: DC
  Administered 2023-12-05 – 2023-12-09 (×5): 20 mg
  Filled 2023-12-04 (×5): qty 1

## 2023-12-04 MED ORDER — SODIUM CHLORIDE 0.9 % IV BOLUS
500.0000 mL | Freq: Once | INTRAVENOUS | Status: AC
  Administered 2023-12-04: 500 mL via INTRAVENOUS

## 2023-12-04 MED ORDER — LACTATED RINGERS IV SOLN: INTRAVENOUS | Status: AC

## 2023-12-04 MED ORDER — LEVOTHYROXINE SODIUM 75 MCG PO TABS
150.0000 ug | ORAL_TABLET | Freq: Every day | ORAL | Status: DC
  Administered 2023-12-05 – 2023-12-09 (×5): 150 ug
  Filled 2023-12-04 (×5): qty 2

## 2023-12-04 MED ORDER — FREE WATER
120.0000 mL | Freq: Three times a day (TID) | Status: DC
  Administered 2023-12-05: 120 mL

## 2023-12-04 NOTE — H&P (Signed)
 History and Physical    Whitney Miller FMW:[id number] DOB: 1940/10/11 DOA: 12/04/2023  PCP: Elliot Charm, MD   Patient coming from: Home   Chief Complaint:  Chief Complaint  Patient presents with   Fall   ED TRIAGE note: Pt bib [facility] coming from home after patient had unwitnessed fall when she was getting out of bed, fell forward, and hit her head. Unsure if LOC. Pt does have swelling to forehead with small abrasion. Pt reporting right hip and left knee pain. EMS reports pt being hypotensive 80/56. GCS 15.    EMS VS: 95/50 65 HR 99% RA 186 cbg             HPI:  Whitney Miller is a 83 y.o. 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  When she sat back down she had urgency to defecate when she stood up she lost her footing fell forward hit her head on a tray on the floor and then also somehow hit her sacrum and is having pain back there has an abrasion to the left knee and.  She was able to get up with the help of her granddaughter and came in for further evaluation.She denies confusion nausea vomiting or repetitive questioning. She is not on any blood thinners. She reports she was just about to give herself her feeding supplementation   ED Course:  At presentation to ED patient is borderline hypotensive otherwise hemodynamically stable.   CT head no acute intercranial abnormality.: MPRESSION: 1. Generalized cerebral atrophy and microvascular disease changes of the supratentorial brain. 2. Mild left frontal scalp soft tissue swelling without evidence for an acute fracture or acute intracranial  abnormality. 3. 1.1 cm anterior right maxillary sinus polyp versus mucous retention cyst.  X-ray bilateral knee no evidence of fracture. Small suprapatellar effusion. X-ray sacrum and pelvis evidence of fracture.  Due to low blood pressure in the ED patient received 1.5 L of NS bolus.  Lab work, CBC showing leukocytosis 12.4 otherwise unremarkable. BMP showing elevated creatinine 2.72 (baseline creatinine around 0.5 per lab check in October 2025).  Hospitalist consulted for further evaluation management of AKI, hypotension and mechanical fall.  Significant labs in the ED: Lab Orders         Basic metabolic panel         CBC with Differential         Creatinine, urine, random         Sodium, urine, random         Urinalysis, Routine w reflex microscopic -Urine, Clean Catch         CBG monitoring, ED       Review of Systems:  ROS  Past Medical History:  Diagnosis Date   Breast cancer (HCC) 02/2000   Rt   Personal history of radiation therapy     Past Surgical History:  Procedure Laterality Date   BREAST LUMPECTOMY Right 02/2000     reports that she has never smoked. She has never used smokeless tobacco. No history on file for alcohol use and drug use.  Allergies  Allergen Reactions   Penicillins Hives   Tylenol [Acetaminophen] Hives   Tetanus Toxoid-Containing Vaccines Dermatitis    History reviewed. No pertinent family  history.  Prior to Admission medications   Medication Sig Start Date End Date Taking? Authorizing Provider  calcium carbonate (TUMS) 500 MG chewable tablet Place 1 tablet into feeding tube in the morning. 10/16/23  Yes [provider]  citalopram (CELEXA) 20 MG tablet Place 20 mg into feeding tube daily. 11/07/16  Yes [provider]  fluticasone (FLONASE) 50 MCG/ACT nasal spray Place 1 spray into both nostrils in the morning.   Yes [provider]  levothyroxine (SYNTHROID) 150 MCG tablet Place 150 mcg into feeding tube  daily before breakfast. 11/07/23 02/05/24 Yes [provider]  lisinopril-hydrochlorothiazide (ZESTORETIC) 20-25 MG tablet Place 1 tablet into feeding tube in the morning. 10/15/16  Yes [provider]  Nutritional Supplements (NUTREN 2.0) LIQD Place 250 mLs into feeding tube in the morning, at noon, and at bedtime.   Yes [provider]  simvastatin (ZOCOR) 40 MG tablet Place 40 mg into feeding tube at bedtime.   Yes [provider]  Water For Irrigation, Sterile (FREE WATER) SOLN Place 120 mLs into feeding tube every 8 (eight) hours.   Yes [provider]  metoprolol tartrate (LOPRESSOR) 100 MG tablet  11/25/16   [provider]     Physical Exam: Vitals:   12/04/23 2215 12/04/23 2230 12/04/23 2231 12/04/23 2303  BP: 99/60 110/62    Pulse: (!) 59 60  62  Resp: (!) 23 (!) 21  20  Temp:   98.2 F (36.8 C)   TempSrc:   Oral   SpO2: 100% 100%  100%    Physical Exam   Labs on Admission: I have personally reviewed following labs and imaging studies  CBC: Recent Labs  Lab 12/04/23 2114  WBC 12.4*  NEUTROABS 10.7*  HGB 10.7*  HCT 33.5*  MCV 88.6  PLT 254   Basic Metabolic Panel: Recent Labs  Lab 12/04/23 2114  NA 136  K 3.9  CL 94*  CO2 32  GLUCOSE 119*  BUN 91*  CREATININE 2.72*  CALCIUM 8.5*   GFR: CrCl cannot be calculated (Unknown ideal weight.). Liver Function Tests: No results for input(s): AST, ALT, ALKPHOS, BILITOT, PROT, ALBUMIN in the last 168 hours. No results for input(s): LIPASE, AMYLASE in the last 168 hours. No results for input(s): AMMONIA in the last 168 hours. Coagulation Profile: No results for input(s): INR, PROTIME in the last 168 hours. Cardiac Enzymes: No results for input(s): CKTOTAL, CKMB, CKMBINDEX, TROPONINI, TROPONINIHS in the last 168 hours. BNP (last 3 results) No results for input(s): BNP in the last 8760 hours. HbA1C: No results for input(s):  HGBA1C in the last 72 hours. CBG: Recent Labs  Lab 12/04/23 1931  GLUCAP 160*   Lipid Profile: No results for input(s): CHOL, HDL, LDLCALC, TRIG, CHOLHDL, LDLDIRECT in the last 72 hours. Thyroid  Function Tests: No results for input(s): TSH, T4TOTAL, FREET4, T3FREE, THYROIDAB in the last 72 hours. Anemia Panel: No results for input(s): VITAMINB12, FOLATE, FERRITIN, TIBC, IRON, RETICCTPCT in the last 72 hours. Urine analysis: No results found for: COLORURINE, APPEARANCEUR, LABSPEC, PHURINE, GLUCOSEU, HGBUR, BILIRUBINUR, KETONESUR, PROTEINUR, UROBILINOGEN, NITRITE, LEUKOCYTESUR  Radiological Exams on Admission: I have personally reviewed images DG Knee Complete 4 Views Left Result Date: 12/04/2023 CLINICAL DATA:  Unwitnessed fall. EXAM: LEFT KNEE - COMPLETE 4+ VIEW COMPARISON:  None Available. FINDINGS: No evidence of an acute fracture or dislocation. There is marked severity tricompartmental joint space narrowing. Lateral marginal osteophyte formation is seen. A small suprapatellar effusion is noted. IMPRESSION: 1. Marked severity  tricompartmental degenerative changes. 2. Small suprapatellar effusion. Electronically Signed   By: Suzen Dials M.D.   On: 12/04/2023 21:13   CT Head Wo Contrast Result Date: 12/04/2023 CLINICAL DATA:  Status post trauma. EXAM: CT HEAD WITHOUT CONTRAST TECHNIQUE: Contiguous axial images were obtained from the base of the skull through the vertex without intravenous contrast. RADIATION DOSE REDUCTION: This exam was performed according to the departmental dose-optimization program which includes automated exposure control, adjustment of the mA and/or kV according to patient size and/or use of iterative reconstruction technique. COMPARISON:  None Available. FINDINGS: Brain: There is generalized cerebral atrophy with widening of the extra-axial spaces and ventricular dilatation. There are areas of decreased  attenuation within the white matter tracts of the supratentorial brain, consistent with microvascular disease changes. Vascular: No hyperdense vessel or unexpected calcification. Skull: Normal. Negative for fracture or focal lesion. Sinuses/Orbits: A 1.1 cm anterior right maxillary sinus polyp versus mucous retention cyst is seen. Other: There is mild left frontal scalp soft tissue swelling. IMPRESSION: 1. Generalized cerebral atrophy and microvascular disease changes of the supratentorial brain. 2. Mild left frontal scalp soft tissue swelling without evidence for an acute fracture or acute intracranial abnormality. 3. 1.1 cm anterior right maxillary sinus polyp versus mucous retention cyst. Electronically Signed   By: Suzen Dials M.D.   On: 12/04/2023 21:12   DG Sacrum/Coccyx Result Date: 12/04/2023 CLINICAL DATA:  Unwitnessed fall. EXAM: SACRUM AND COCCYX - 2+ VIEW COMPARISON:  None Available. FINDINGS: There is no evidence of an acute fracture or other focal bone lesions. IMPRESSION: Negative. Electronically Signed   By: Suzen Dials M.D.   On: 12/04/2023 21:10     EKG: My personal interpretation of EKG shows: EKG showed normal sinus rhythm heart rate 62    Assessment/Plan: Principal Problem:   AKI (acute kidney injury) Active Problems:   Hyperlipidemia   Essential hypertension   Fall at home, initial encounter   History of thyroid  cancer   Feeding by G-tube (HCC)   Hypothyroidism   Generalized anxiety disorder    Assessment and Plan: Acute kidney injury-secondary to hypotension -Patient presented emergency department secondary to for mechanical fall after she has a bowel movement and when she came to bed and stood up.  Patient denies any loss of consciousness and head injury.  At presentation to ED patient found borderline hypotensive otherwise hemodynamically stable. - CT head, x-ray knee and x-ray sacrum no evidence of acute traumatic injury. - Further workup revealed  elevated creatinine 2.72.  Concern for acute kidney injury in the setting of lisinopril and hydrochlorothiazide which is causing hypotension. - Checking UA, urine creatinine and urine sodium - Holding blood pressure regimen - In the ED patient received 1.5 L of NS bolus.  Continue maintenance fluid LR 75 cc/h. -Continue to monitor improvement of renal function, avoid nephrotoxic agent and monitor urine output.   Mechanical fall secondary to hypotension -Mechanical fall secondary to hypotension.  Holding blood pressure regimen. - Consulted inpatient PT and OT for further evaluation.  History of thyroid  cancer status post thyroidectomy Acquired hypothyroidism - Recently diagnosed with thyroid  cancer status post thyroidectomy and tracheostomy dependent. - Continue oral levothyroxine and calcium supplement per G-tube. -Patient was recently discharged from [facility] need to follow-up with outpatient oncologist and otolaryngologist.  Chronic G-tube dependent -Continue G-tube feeding.  Chronic tracheostomy dependent -Continue tracheostomy care.  Consulted wound care for further management.  Generalized anxiety disorder -Continue Celexa  DVT prophylaxis:  SQ Heparin Code Status:  Full Code Diet: Completely NPO.  Continue G-tube feeding Family Communication:   Family was present at bedside, at the time of interview. Opportunity was given to ask question and all questions were answered satisfactorily.  Disposition Plan: Continue monitor improvement of renal function Consults: Wound care, PT and OT Admission status:   Inpatient, Telemetry bed  Severity of Illness: The appropriate patient status for this patient is INPATIENT. Inpatient status is judged to be reasonable and necessary in order to provide the required intensity of service to ensure the patient's safety. The patient's presenting symptoms, physical exam findings, and initial radiographic and laboratory data in the context of  their chronic comorbidities is felt to place them at high risk for further clinical deterioration. Furthermore, it is not anticipated that the patient will be medically stable for discharge from the hospital within 2 midnights of admission.   * I certify that at the point of admission it is my clinical judgment that the patient will require inpatient hospital care spanning beyond 2 midnights from the point of admission due to high intensity of service, high risk for further deterioration and high frequency of surveillance required.DEWAINE    Suhayla Chisom, MD [facility]  How to contact the [facility] Attending or Consulting provider 7A - 7P or covering provider during after hours 7P -7A, for this patient.  Check the care team in [facility] and look for a) attending/consulting [facility] provider listed and b) the [facility] team listed Log into [email or web address] and use [facility] universal password to access. If you do not have the password, please contact the hospital operator. Locate the [facility] provider you are looking for under [facility] and page to a number that you can be directly reached. If you still have difficulty reaching the provider, please page the [facility] (Director on Call) for the Hospitalists listed on amion for assistance.  12/04/2023, 11:18 PM

## 2023-12-04 NOTE — ED Provider Notes (Signed)
 [place] EMERGENCY DEPARTMENT AT [facility] Provider Note   CSN: [id number] Arrival date & time: 12/04/23  1910     Patient presents with: Whitney Miller is a 83 y.o. female  with a pmh of thyroid  cancer s/p tracheostomy and feeding tube placement. Patient reports that she had just returned to her bed after using the bathroom.  When she sat back down she had urgency to defecate when she stood up she lost her footing fell forward hit her head on a tray on the floor and then also somehow hit her sacrum and is having pain back there has an abrasion to the left knee and.  She was able to get up with the help of her granddaughter and came in for further evaluation.  She denies loss of consciousness.  She denies confusion nausea vomiting or repetitive questioning.  She is not on any blood thinners.  She reports she was just about to give herself her feeding supplementation  {Add pertinent medical, surgical, social history, OB history to YEP:67052}  Fall       Prior to Admission medications   Medication Sig Start Date End Date Taking? Authorizing Provider  cholecalciferol (VITAMIN D3) 25 MCG (1000 UNIT) tablet 1 tablet 04/24/19   [provider]  citalopram  (CELEXA ) 20 MG tablet  11/07/16   [provider]  fluticasone (FLONASE) 50 MCG/ACT nasal spray 1 spray in each nostril    [provider]  lisinopril-hydrochlorothiazide (PRINZIDE,ZESTORETIC) 20-12.5 MG tablet  10/15/16   [provider]  meloxicam (MOBIC) 7.5 MG tablet 1 tablet Patient not taking: Reported on 08/13/2023 06/17/18   [provider]  metoprolol tartrate (LOPRESSOR) 100 MG tablet  11/25/16   [provider]  NONFORMULARY OR COMPOUNDED ITEM Antifungal solution: Terbinafine 3%, Fluconazole 2%, Tea Tree Oil 5%, Urea 10%, Ibuprofen 2% in DMSO suspension #30mL 06/25/19   [place]alaway, Delon CROME, DPM  simvastatin  (ZOCOR ) 40 MG tablet 1 tablet    [provider]  terbinafine (LAMISIL AT) 1 % cream 1 application 06/17/19   [provider]    Allergies: Penicillins and Tylenol [acetaminophen]    Review of Systems  Updated Vital Signs BP (!) 108/53 (BP Location: Right Arm)   Pulse 63   Temp 97.8 F (36.6 C) (Oral)   Resp 16   SpO2 100%   Physical Exam Vitals and nursing note reviewed.  Constitutional:      General: She is not in acute distress.    Appearance: She is well-developed. She is not diaphoretic.  HENT:     Head: Normocephalic.     Comments: Small abrasion to the forehead and 0.5 cm scratch  in the middle. No bleeding     Right Ear: External ear normal.     Left Ear: External ear normal.     Nose: Nose normal.     Mouth/Throat:     Mouth: Mucous membranes are moist.  Eyes:     General: No scleral icterus.    Conjunctiva/sclera: Conjunctivae normal.  Cardiovascular:     Rate and Rhythm: Normal rate and regular rhythm.     Heart sounds: Normal heart sounds. No murmur heard.    No friction rub. No gallop.  Pulmonary:     Effort: Pulmonary effort is normal. No respiratory distress.     Breath sounds: Normal breath sounds.  Abdominal:     General: Bowel sounds are normal. There is no distension.     Palpations:  Abdomen is soft. There is no mass.     Tenderness: There is no abdominal tenderness. There is no guarding.  Musculoskeletal:     Cervical back: Normal range of motion.     Comments: Pelvis is stable TTP over sacrum  Normal strength and ROM, BL hips  TTP over L patella, small abrasion noted Normal rom  Skin:    General: Skin is warm and dry.  Neurological:     Mental Status: She is alert and oriented to person, place, and time.  Psychiatric:        Behavior: Behavior normal.     (all labs ordered are listed, but only abnormal results are displayed) Labs Reviewed - No data to display  EKG: None  Radiology: No results found.  {Document cardiac monitor, telemetry assessment  procedure when appropriate:32947} Procedures   Medications Ordered in the ED - No data to display    {Click here for ABCD2, HEART and other calculators REFRESH Note before signing:1}                              Medical Decision Making Amount and/or Complexity of Data Reviewed Labs: ordered. Radiology: ordered.  Risk Decision regarding hospitalization.   ***  {Document critical care time when appropriate  Document review of labs and clinical decision tools ie CHADS2VASC2, etc  Document your independent review of radiology images and any outside records  Document your discussion with family members, caretakers and with consultants  Document social determinants of health affecting pt's care  Document your decision making why or why not admission, treatments were needed:32947:::1}   Final diagnoses:  None    ED Discharge Orders     None

## 2023-12-04 NOTE — ED Triage Notes (Signed)
 Pt bib [facility] coming from home after patient had unwitnessed fall when she was getting out of bed, fell forward, and hit her head. Unsure if LOC. Pt does have swelling to forehead with small abrasion. Pt reporting right hip and left knee pain. EMS reports pt being hypotensive 80/56. GCS 15.   EMS VS: 95/50 65 HR 99% RA 186 cbg

## 2023-12-05 ENCOUNTER — Encounter (HOSPITAL_COMMUNITY): Payer: Self-pay | Admitting: Internal Medicine

## 2023-12-05 DIAGNOSIS — I951 Orthostatic hypotension: Secondary | ICD-10-CM | POA: Diagnosis present

## 2023-12-05 DIAGNOSIS — R6520 Severe sepsis without septic shock: Secondary | ICD-10-CM | POA: Diagnosis present

## 2023-12-05 DIAGNOSIS — E43 Unspecified severe protein-calorie malnutrition: Secondary | ICD-10-CM | POA: Diagnosis present

## 2023-12-05 DIAGNOSIS — N390 Urinary tract infection, site not specified: Secondary | ICD-10-CM | POA: Diagnosis present

## 2023-12-05 DIAGNOSIS — Z923 Personal history of irradiation: Secondary | ICD-10-CM | POA: Diagnosis not present

## 2023-12-05 DIAGNOSIS — Z6822 Body mass index (BMI) 22.0-22.9, adult: Secondary | ICD-10-CM | POA: Diagnosis not present

## 2023-12-05 DIAGNOSIS — D649 Anemia, unspecified: Secondary | ICD-10-CM | POA: Diagnosis present

## 2023-12-05 DIAGNOSIS — F411 Generalized anxiety disorder: Secondary | ICD-10-CM | POA: Diagnosis present

## 2023-12-05 DIAGNOSIS — Z79890 Hormone replacement therapy (postmenopausal): Secondary | ICD-10-CM | POA: Diagnosis not present

## 2023-12-05 DIAGNOSIS — Y92009 Unspecified place in unspecified non-institutional (private) residence as the place of occurrence of the external cause: Secondary | ICD-10-CM | POA: Diagnosis not present

## 2023-12-05 DIAGNOSIS — R7989 Other specified abnormal findings of blood chemistry: Secondary | ICD-10-CM | POA: Diagnosis present

## 2023-12-05 DIAGNOSIS — E890 Postprocedural hypothyroidism: Secondary | ICD-10-CM | POA: Diagnosis present

## 2023-12-05 DIAGNOSIS — I10 Essential (primary) hypertension: Secondary | ICD-10-CM | POA: Diagnosis present

## 2023-12-05 DIAGNOSIS — W06XXXA Fall from bed, initial encounter: Secondary | ICD-10-CM | POA: Diagnosis present

## 2023-12-05 DIAGNOSIS — Z853 Personal history of malignant neoplasm of breast: Secondary | ICD-10-CM | POA: Diagnosis not present

## 2023-12-05 DIAGNOSIS — M25552 Pain in left hip: Secondary | ICD-10-CM | POA: Diagnosis present

## 2023-12-05 DIAGNOSIS — A419 Sepsis, unspecified organism: Secondary | ICD-10-CM | POA: Diagnosis present

## 2023-12-05 DIAGNOSIS — N179 Acute kidney failure, unspecified: Secondary | ICD-10-CM | POA: Diagnosis present

## 2023-12-05 DIAGNOSIS — Z85850 Personal history of malignant neoplasm of thyroid: Secondary | ICD-10-CM | POA: Diagnosis not present

## 2023-12-05 DIAGNOSIS — Z931 Gastrostomy status: Secondary | ICD-10-CM | POA: Diagnosis not present

## 2023-12-05 DIAGNOSIS — Z930 Tracheostomy status: Secondary | ICD-10-CM | POA: Diagnosis not present

## 2023-12-05 DIAGNOSIS — E785 Hyperlipidemia, unspecified: Secondary | ICD-10-CM | POA: Diagnosis present

## 2023-12-05 DIAGNOSIS — Z79899 Other long term (current) drug therapy: Secondary | ICD-10-CM | POA: Diagnosis not present

## 2023-12-05 DIAGNOSIS — S80212A Abrasion, left knee, initial encounter: Secondary | ICD-10-CM | POA: Diagnosis present

## 2023-12-05 LAB — COMPREHENSIVE METABOLIC PANEL WITH GFR
ALT: 27 U/L (ref 0–44)
AST: 33 U/L (ref 15–41)
Albumin: 2.6 g/dL — ABNORMAL LOW (ref 3.5–5.0)
Alkaline Phosphatase: 57 U/L (ref 38–126)
Anion gap: 9 (ref 5–15)
BUN: 80 mg/dL — ABNORMAL HIGH (ref 8–23)
CO2: 28 mmol/L (ref 22–32)
Calcium: 7.8 mg/dL — ABNORMAL LOW (ref 8.9–10.3)
Chloride: 100 mmol/L (ref 98–111)
Creatinine, Ser: 2.22 mg/dL — ABNORMAL HIGH (ref 0.44–1.00)
GFR, Estimated: 22 mL/min — ABNORMAL LOW (ref >60)
Glucose, Bld: 99 mg/dL (ref 70–99)
Potassium: 3.9 mmol/L (ref 3.5–5.1)
Sodium: 137 mmol/L (ref 135–145)
Total Bilirubin: 0.8 mg/dL (ref 0.0–1.2)
Total Protein: 6.5 g/dL (ref 6.5–8.1)

## 2023-12-05 LAB — CBC
HCT: 31.5 % — ABNORMAL LOW (ref 36.0–46.0)
Hemoglobin: 10.1 g/dL — ABNORMAL LOW (ref 12.0–15.0)
MCH: 28.4 pg (ref 26.0–34.0)
MCHC: 32.1 g/dL (ref 30.0–36.0)
MCV: 88.5 fL (ref 80.0–100.0)
Platelets: 218 K/uL (ref 150–400)
RBC: 3.56 MIL/uL — ABNORMAL LOW (ref 3.87–5.11)
RDW: 13.1 % (ref 11.5–15.5)
WBC: 9 K/uL (ref 4.0–10.5)
nRBC: 0 % (ref 0.0–0.2)

## 2023-12-05 LAB — URINALYSIS, ROUTINE W REFLEX MICROSCOPIC
Bilirubin Urine: NEGATIVE
Glucose, UA: NEGATIVE mg/dL
Hgb urine dipstick: NEGATIVE
Ketones, ur: NEGATIVE mg/dL
Nitrite: NEGATIVE
Protein, ur: NEGATIVE mg/dL
Specific Gravity, Urine: 1.011 (ref 1.005–1.030)
WBC, UA: >50 WBC/hpf (ref 0–5)
pH: 6 (ref 5.0–8.0)

## 2023-12-05 LAB — GLUCOSE, CAPILLARY
Glucose-Capillary: 88 mg/dL (ref 70–99)
Glucose-Capillary: 128 mg/dL — ABNORMAL HIGH (ref 70–99)
Glucose-Capillary: 142 mg/dL — ABNORMAL HIGH (ref 70–99)

## 2023-12-05 LAB — SODIUM, URINE, RANDOM: Sodium, Ur: 70 mmol/L

## 2023-12-05 LAB — CBG MONITORING, ED: Glucose-Capillary: 97 mg/dL (ref 70–99)

## 2023-12-05 LAB — CREATININE, URINE, RANDOM: Creatinine, Urine: 77 mg/dL

## 2023-12-05 MED ORDER — FREE WATER
120.0000 mL | Freq: Every day | Status: DC
  Administered 2023-12-05 – 2023-12-09 (×23): 120 mL

## 2023-12-05 MED ORDER — SODIUM CHLORIDE 0.9% FLUSH
3.0000 mL | Freq: Two times a day (BID) | INTRAVENOUS | Status: DC
  Administered 2023-12-05 – 2023-12-09 (×8): 3 mL via INTRAVENOUS

## 2023-12-05 MED ORDER — ONDANSETRON HCL 4 MG PO TABS: 4.0000 mg | ORAL_TABLET | Freq: Four times a day (QID) | ORAL | Status: DC | PRN

## 2023-12-05 MED ORDER — PROSOURCE TF20 ENFIT COMPATIBL EN LIQD
60.0000 mL | Freq: Every day | ENTERAL | Status: DC
  Administered 2023-12-05 – 2023-12-09 (×5): 60 mL
  Filled 2023-12-05 (×5): qty 60

## 2023-12-05 MED ORDER — ONDANSETRON HCL 4 MG/2ML IJ SOLN: 4.0000 mg | Freq: Four times a day (QID) | INTRAMUSCULAR | Status: DC | PRN

## 2023-12-05 MED ORDER — HEPARIN SODIUM (PORCINE) 5000 UNIT/ML IJ SOLN
5000.0000 [IU] | Freq: Three times a day (TID) | INTRAMUSCULAR | Status: DC
  Administered 2023-12-05 – 2023-12-09 (×12): 5000 [IU] via SUBCUTANEOUS
  Filled 2023-12-05 (×13): qty 1

## 2023-12-05 MED ORDER — FREE WATER: 60.0000 mL | Freq: Every day | Status: DC

## 2023-12-05 MED ORDER — OSMOLITE 1.5 CAL PO LIQD
165.0000 mL | Freq: Every day | ORAL | Status: DC
  Administered 2023-12-05 – 2023-12-09 (×22): 165 mL

## 2023-12-05 MED ORDER — SODIUM CHLORIDE 0.9% FLUSH
3.0000 mL | Freq: Two times a day (BID) | INTRAVENOUS | Status: DC
  Administered 2023-12-05 – 2023-12-09 (×7): 3 mL via INTRAVENOUS

## 2023-12-05 MED ORDER — SODIUM CHLORIDE 0.9% FLUSH: 3.0000 mL | INTRAVENOUS | Status: DC | PRN

## 2023-12-05 MED ORDER — SODIUM CHLORIDE 0.9 % IV SOLN: 250.0000 mL | INTRAVENOUS | Status: AC | PRN

## 2023-12-05 NOTE — Consult Note (Addendum)
 WOC Nurse ostomy consult note: Reason for Consult: requested tracheostomy care per dr. Sundil. I visited this patient in person. Her tracheostomy dressing is clean and dry, no peri-ostomy injuries. Respiratory function is preserved, no sputum or shortness of breath. Patient verbalized well. Didn't show any concern about ostomy care.  WOC team will not plan to follow further. Please reconsult if further assistance is needed. Thank-you,  Lela Holm MSN, RN, CNS.  (Phone [phone])

## 2023-12-05 NOTE — Progress Notes (Signed)
 Initial Nutrition Assessment  DOCUMENTATION CODES:  Severe malnutrition in context of chronic illness  INTERVENTION:  165 mL Osmolite 1.5 X 6 daily via G-tube. 60 mL water flushes before and after each feeding. 1 packet ProSource TF20 daily via G-tube. This enteral nutrition regimen provides 990 mL formula, 1565 Kcals, 82 g protein, 202 g carbohydrates, and 1474 mL free water daily, which meets 100% of the patient's estimated nutrition needs. Continue Dysphagia 1 diet with thin liquids as tolerated. Resume home enteral nutrition regimen upon discharge:  125 mL (1/2 carton) of Nutren 2.0 X 6 daily with 30 mL water flushes before and after each feeding, plus 160 mL TID.   NUTRITION DIAGNOSIS:  Severe Malnutrition related to chronic illness as evidenced by severe muscle depletion, mild fat depletion, percent weight loss   GOAL:  Patient will meet greater than or equal to 90% of their needs   MONITOR:  Labs, Weight trends, TF tolerance, PO intake  REASON FOR ASSESSMENT:  Consult Enteral/tube feeding initiation and management  ASSESSMENT:  Patient presented s/p fall and was found to have hypotension. PMH significant for papillary thyroid  carcinoma s/p thyroidectomy 10/04/23, chronic tracheostomy, G-tube dependence, acquired hypothyroidism, HTN, dyslipidemia, Vitamin D deficiency, and anxiety.  Visited the patient who states that she has been tolerating her home TF regimen and is OK with the 6 times a day schedule. She tolerates pureed food with thin liquids although has minimal PO intake. She tells me she works with SLP regularly outpatient. Her UBW is 152 lbs which she last weighed in September. She confirms having lost significant weight even while on TF. She is amenable to utilizing Osmolite 1.5 while admitted. She tells me she is annoyed with the whistling sound from her trach. She declines ONS for now.  Scheduled Meds:  calcium carbonate  1 tablet Per Tube q AM   citalopram  20 mg  Per Tube Daily   free water  120 mL Per Tube Q8H   heparin  5,000 Units Subcutaneous Q8H   levothyroxine  150 mcg Per Tube QAC breakfast   simvastatin  40 mg Per Tube QHS   sodium chloride flush  3 mL Intravenous Q12H   sodium chloride flush  3 mL Intravenous Q12H   Continuous Infusions:  sodium chloride     lactated ringers 75 mL/hr at 12/05/23 0115    Diet Order             DIET - DYS 1 Room service appropriate? Yes; Fluid consistency: Thin  Diet effective now                  Meal Intake: N/A  Labs:     Latest Ref Rng & Units 12/05/2023    5:28 AM 12/04/2023    9:14 PM 11/21/2007    9:50 AM  CMP  Glucose 70 - 99 mg/dL 99  880  891   BUN 8 - 23 mg/dL 80  91  16   Creatinine 0.44 - 1.00 mg/dL 7.77  7.27  9.09   Sodium 135 - 145 mmol/L 137  136  141   Potassium 3.5 - 5.1 mmol/L 3.9  3.9  4.1   Chloride 98 - 111 mmol/L 100  94  105   CO2 22 - 32 mmol/L 28  32  26   Calcium 8.9 - 10.3 mg/dL 7.8  8.5  9.1   Total Protein 6.5 - 8.1 g/dL 6.5   7.8   Total Bilirubin 0.0 - 1.2 mg/dL  0.8   0.2   Alkaline Phos 38 - 126 U/L 57   111   AST 15 - 41 U/L 33   20   ALT 0 - 44 U/L 27   13    I/O: No data charted.   NUTRITION - FOCUSED PHYSICAL EXAM: Flowsheet Row Most Recent Value  Orbital Region No depletion  Upper Arm Region Mild depletion  Thoracic and Lumbar Region Mild depletion  Buccal Region Mild depletion  Temple Region Moderate depletion  Clavicle Bone Region Moderate depletion  Clavicle and Acromion Bone Region Severe depletion  Scapular Bone Region Severe depletion  Dorsal Hand Moderate depletion  Patellar Region No depletion  Anterior Thigh Region No depletion  Posterior Calf Region Severe depletion  Edema (RD Assessment) None  Hair Reviewed  Eyes Reviewed  Mouth Reviewed  Skin Reviewed  Nails Reviewed    EDUCATION NEEDS:  Education needs have been addressed  Skin:  Skin Assessment: Reviewed RN Assessment  Last BM:  PTA  Height:  Ht Readings  from Last 1 Encounters:  12/05/23 5' 3 (1.6 m)   Weight:  Wt Readings from Last 10 Encounters:  12/05/23 58.1 kg  08/13/23 68 kg  06/11/23 68 kg   Weight Change: 11 Kg (16%) loss in 3 months = severe  Usual Body Weight: 152 lbs 09/2023  Edema: none  Ideal Body Weight:  52 kg   BMI:  Body mass index is 22.67 kg/m.  Estimated Nutritional Needs:  Kcal:  1450-1750 Protein:  75-100 Fluid:  >1450    Whitney Ruth, MS, RDN, LDN [place]. [facility] See AMION for contact information Secure chat preferred

## 2023-12-05 NOTE — Plan of Care (Signed)
   Problem: Education: Goal: Knowledge of General Education information will improve Description: Including pain rating scale, medication(s)/side effects and non-pharmacologic comfort measures Outcome: Progressing   Problem: Clinical Measurements: Goal: Ability to maintain clinical measurements within normal limits will improve Outcome: Progressing Goal: Will remain free from infection Outcome: Progressing   Problem: Activity: Goal: Risk for activity intolerance will decrease Outcome: Progressing   Problem: Nutrition: Goal: Adequate nutrition will be maintained Outcome: Progressing   Problem: Pain Managment: Goal: General experience of comfort will improve and/or be controlled Outcome: Progressing   Problem: Safety: Goal: Ability to remain free from injury will improve Outcome: Progressing

## 2023-12-05 NOTE — Evaluation (Signed)
 Occupational Therapy Evaluation Patient Details Name: Whitney Miller MRN: [id number] DOB: [birth year] Today's Date: 12/05/2023   History of Present Illness   Pt is an 83 year old woman admitted on 12/04/23 after a fall. + hypotension and acute kidney injury. Sustained forehead abrasion, buttocks and R knee pain, all imaging negative for fx. PMH: thyroid  ca s/p thyroidectomy 10/04/23, tracheostomy, g-tube, GAD, HTN, HLD.     Clinical Impressions Since her thyroidectomy in October, pt has been walking with a RW , participating in HHPT and speech and able to perform basic ADLs modified independently. She manages her tube feedings and changes the inner canulaof her trach. She relies on her granddaughter for med management and changing her trach biweekly. Her significant other of 38 years helps with IADLs. Prior to October, pt was independent, driving and working part time cleaning an office building. Presents with decreased balance requiring RW and supervision for safety and lines. She needs set up to supervision for ADLs. SpO2 96% with trach capped after ambulation. Do not anticipate pt will need post acute OT.      If plan is discharge home, recommend the following:   Assistance with cooking/housework;Assist for transportation;Help with stairs or ramp for entrance;Direct supervision/assist for medications management     Functional Status Assessment   Patient has had a recent decline in their functional status and demonstrates the ability to make significant improvements in function in a reasonable and predictable amount of time.     Equipment Recommendations   None recommended by OT     Recommendations for Other Services         Precautions/Restrictions   Precautions Precautions: Fall Recall of Precautions/Restrictions: Intact     Mobility Bed Mobility Overal bed mobility: Modified Independent                  Transfers Overall transfer level: Needs  assistance Equipment used: Rolling walker (2 wheels) Transfers: Sit to/from Stand Sit to Stand: Supervision                  Balance Overall balance assessment: Needs assistance   Sitting balance-Leahy Scale: Good       Standing balance-Leahy Scale: Poor Standing balance comment: reliant on RW                           ADL either performed or assessed with clinical judgement   ADL Overall ADL's : Needs assistance/impaired Eating/Feeding: Independent;Bed level   Grooming: Supervision/safety;Standing;Wash/dry hands   Upper Body Bathing: Set up;Sitting   Lower Body Bathing: Supervison/ safety;Sit to/from stand   Upper Body Dressing : Set up;Sitting   Lower Body Dressing: Supervision/safety;Sit to/from stand   Toilet Transfer: Supervision/safety;Ambulation;Rolling walker (2 wheels)   Toileting- Clothing Manipulation and Hygiene: Supervision/safety;Sit to/from stand       Functional mobility during ADLs: Supervision/safety;Rolling walker (2 wheels)       Vision Baseline Vision/History: 1 Wears glasses Ability to See in Adequate Light: 0 Adequate Patient Visual Report: No change from baseline       Perception         Praxis         Pertinent Vitals/Pain Pain Assessment Pain Assessment: No/denies pain     Extremity/Trunk Assessment Upper Extremity Assessment Upper Extremity Assessment: Right hand dominant;Overall [facility] for tasks assessed   Lower Extremity Assessment Lower Extremity Assessment: Defer to PT evaluation       Communication Communication Communication: Other (comment) Factors  Affecting Communication: Other (comment) (speaks with trach capped)   Cognition Arousal: Alert Behavior During Therapy: WFL for tasks assessed/performed Cognition: No apparent impairments             OT - Cognition Comments: somewhat of a difficult historian                 Following commands: Intact       Cueing  General  Comments   Cueing Techniques: Verbal cues      Exercises     Shoulder Instructions      Home Living Family/patient expects to be discharged to:: Private residence Living Arrangements: Spouse/significant other Available Help at Discharge: Friend(s);Available 24 hours/day;Family Type of Home: House Home Access: Stairs to enter [employer] of Steps: 4 Entrance Stairs-Rails: None Home Layout: Two level;Able to live on main level with bedroom/bathroom     Bathroom Shower/Tub: Walk-in shower   Bathroom Toilet: Handicapped height     Home Equipment: Cane - single Information [employer] Manager (2 wheels);Rollator (4 wheels);Hand held shower head;Grab bars - tub/shower;Grab bars - toilet (suction cup grab bars)          Prior Functioning/Environment Prior Level of Function : Needs assist             Mobility Comments: walks with RW, has HHPT ADLs Comments: 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, niece helps with medication and changes her trach, pt manages her tube feedings    OT Problem List: Impaired balance (sitting and/or standing)   OT Treatment/Interventions: Self-care/ADL training      OT Goals(Current goals can be found in the care plan section)   Acute Rehab OT Goals OT Goal Formulation: With patient Time For Goal Achievement: 12/19/23 Potential to Achieve Goals: Good ADL Goals Pt Will Transfer to Toilet: with modified independence;ambulating;regular height toilet Pt Will Perform Toileting - Clothing Manipulation and hygiene: with modified independence;sit to/from stand Additional ADL Goal #1: Pt will complete basic ADLs mod I.   OT Frequency:  Min 1X/week    Co-evaluation              AM-PAC OT 6 Clicks Daily Activity     Outcome Measure Help from another person eating meals?: None Help from another person taking care of personal grooming?: A Little Help from another person toileting, which includes  using toliet, bedpan, or urinal?: A Little Help from another person bathing (including washing, rinsing, drying)?: A Little Help from another person to put on and taking off regular upper body clothing?: A Little Help from another person to put on and taking off regular lower body clothing?: A Little 6 Click Score: 19   End of Session Equipment Utilized During Treatment: Rolling walker (2 wheels);Gait belt  Activity Tolerance: Patient tolerated treatment well Patient left: in bed;with call bell/phone within reach;Other (comment) (RT in room)  OT Visit Diagnosis: Unsteadiness on feet (R26.81);History of falling (Z91.81)                Time: 8567-8490 OT Time Calculation (min): 37 min Charges:  OT General Charges $OT Visit: 1 Visit OT Evaluation $OT Eval Low Complexity: 1 Low OT Treatments $Self Care/Home Management : 8-22 mins  Mliss HERO, OTR/L Acute Rehabilitation Services Office: [phone]   Kennth Mliss Helling 12/05/2023, 3:19 PM

## 2023-12-05 NOTE — Evaluation (Signed)
 Physical Therapy Evaluation Patient Details Name: Whitney Miller MRN: [id number] DOB: 1940-12-24 Today's Date: 12/05/2023  History of Present Illness  Pt is an 83 year old woman admitted on 12/04/23 after a fall. + hypotension and acute kidney injury. Sustained forehead abrasion, buttocks and R knee pain, all imaging negative for fx. PMH: thyroid  ca s/p thyroidectomy 10/04/23, tracheostomy, g-tube, GAD, HTN, HLD.  Clinical Impression  Pt admitted with/for fall including reasons stated above.  Pt is mostly at a cga to supervision level, though is deconditioned and mildly weak.  Pt currently limited functionally due to the problems listed below.  (see problems list.)  Pt will benefit from PT to maximize function and safety to be able to get home safely with available assist .         If plan is discharge home, recommend the following:  (PRN assist)   Can travel by private vehicle        Equipment Recommendations None recommended by PT  Recommendations for Other Services       Functional Status Assessment Patient has had a recent decline in their functional status and demonstrates the ability to make significant improvements in function in a reasonable and predictable amount of time.     Precautions / Restrictions Precautions Precautions: Fall Recall of Precautions/Restrictions: Intact      Mobility  Bed Mobility Overal bed mobility: Modified Independent                  Transfers Overall transfer level: Needs assistance Equipment used: Rolling walker (2 wheels) Transfers: Sit to/from Stand Sit to Stand: Supervision                Ambulation/Gait Ambulation/Gait assistance: Supervision Gait Distance (Feet): 240 Feet Assistive device: Rolling walker (2 wheels) Gait Pattern/deviations: Step-through pattern   Gait velocity interpretation: <1.8 ft/sec, indicate of risk for recurrent falls   General Gait Details: steady, but slower, mildly  antalgic  Stairs            Wheelchair Mobility     Tilt Bed    Modified Rankin (Stroke Patients Only)       Balance Overall balance assessment: Needs assistance   Sitting balance-Leahy Scale: Good       Standing balance-Leahy Scale: Fair Standing balance comment: prefers stationary surface, stood statice without AD                             Pertinent Vitals/Pain Pain Assessment Pain Assessment: No/denies pain    Home Living Family/patient expects to be discharged to:: Private residence Living Arrangements: Spouse/significant other Available Help at Discharge: Friend(s);Available 24 hours/day;Family Type of Home: House Home Access: Stairs to enter Entrance Stairs-Rails: None Entrance Stairs-Number of Steps: 4   Home Layout: Two level;Able to live on main level with bedroom/bathroom Home Equipment: Cane - single [employer] Manager (2 wheels);Rollator (4 wheels);Hand held shower head;Grab bars - tub/shower;Grab bars - toilet (suction cup grab bars)      Prior Function Prior Level of Function : Needs assist             Mobility Comments: walks with RW, has HHPT ADLs Comments: 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, niece helps with medication and changes her trach, pt manages her tube feedings     Extremity/Trunk Assessment   Upper Extremity Assessment Upper Extremity Assessment: Defer to OT evaluation    Lower Extremity Assessment Lower Extremity  Assessment: Overall WFL for tasks assessed (mild weakness/deconditioning)       Communication   Communication Communication: Other (comment) (trach) Factors Affecting Communication: Other (comment) (speaks with trach capped)    Cognition Arousal: Alert Behavior During Therapy: WFL for tasks assessed/performed                             Following commands: Intact       Cueing Cueing Techniques: Verbal cues     General  Comments      Exercises     Assessment/Plan    PT Assessment Patient needs continued PT services  PT Problem List Decreased strength;Decreased activity tolerance;Decreased mobility;Decreased balance       PT Treatment Interventions Gait training;Stair training;Functional mobility training;Therapeutic activities;Balance training;Patient/family education    PT Goals (Current goals can be found in the Care Plan section)  Acute Rehab PT Goals Patient Stated Goal: back independent PT Goal Formulation: With patient Time For Goal Achievement: 12/09/23 Potential to Achieve Goals: Good    Frequency Min 2X/week     Co-evaluation               AM-PAC PT 6 Clicks Mobility  Outcome Measure Help needed turning from your back to your side while in a flat bed without using bedrails?: None Help needed moving from lying on your back to sitting on the side of a flat bed without using bedrails?: None Help needed moving to and from a bed to a chair (including a wheelchair)?: A Little Help needed standing up from a chair using your arms (e.g., wheelchair or bedside chair)?: A Little Help needed to walk in hospital room?: A Little Help needed climbing 3-5 steps with a railing? : A Little 6 Click Score: 20    End of Session   Activity Tolerance: Patient tolerated treatment well Patient left: in bed Nurse Communication: Mobility status PT Visit Diagnosis: Unsteadiness on feet (R26.81);Other abnormalities of gait and mobility (R26.89)    Time: 8199-8165 PT Time Calculation (min) (ACUTE ONLY): 34 min   Charges:   PT Evaluation $PT Eval Moderate Complexity: 1 Mod PT Treatments $Gait Training: 8-22 mins PT General Charges $$ ACUTE PT VISIT: 1 Visit         12/05/2023  [place] HERO., PT Acute Rehabilitation Services [phone]  (office)  Vinie GAILS Logen Fowle 12/05/2023, 7:15 PM

## 2023-12-06 ENCOUNTER — Inpatient Hospital Stay (HOSPITAL_COMMUNITY)

## 2023-12-06 DIAGNOSIS — N179 Acute kidney failure, unspecified: Secondary | ICD-10-CM | POA: Diagnosis not present

## 2023-12-06 LAB — CBC WITH DIFFERENTIAL/PLATELET
Abs Immature Granulocytes: 0.02 K/uL (ref 0.00–0.07)
Basophils Absolute: 0 K/uL (ref 0.0–0.1)
Basophils Relative: 0 %
Eosinophils Absolute: 0.1 K/uL (ref 0.0–0.5)
Eosinophils Relative: 1 %
HCT: 30.3 % — ABNORMAL LOW (ref 36.0–46.0)
Hemoglobin: 9.8 g/dL — ABNORMAL LOW (ref 12.0–15.0)
Immature Granulocytes: 0 %
Lymphocytes Relative: 21 %
Lymphs Abs: 1.5 K/uL (ref 0.7–4.0)
MCH: 28.2 pg (ref 26.0–34.0)
MCHC: 32.3 g/dL (ref 30.0–36.0)
MCV: 87.1 fL (ref 80.0–100.0)
Monocytes Absolute: 0.7 K/uL (ref 0.1–1.0)
Monocytes Relative: 9 %
Neutro Abs: 4.9 K/uL (ref 1.7–7.7)
Neutrophils Relative %: 69 %
Platelets: 229 K/uL (ref 150–400)
RBC: 3.48 MIL/uL — ABNORMAL LOW (ref 3.87–5.11)
RDW: 13 % (ref 11.5–15.5)
WBC: 7.2 K/uL (ref 4.0–10.5)
nRBC: 0 % (ref 0.0–0.2)

## 2023-12-06 LAB — BASIC METABOLIC PANEL WITH GFR
Anion gap: 5 (ref 5–15)
BUN: 64 mg/dL — ABNORMAL HIGH (ref 8–23)
CO2: 31 mmol/L (ref 22–32)
Calcium: 8 mg/dL — ABNORMAL LOW (ref 8.9–10.3)
Chloride: 101 mmol/L (ref 98–111)
Creatinine, Ser: 1.64 mg/dL — ABNORMAL HIGH (ref 0.44–1.00)
GFR, Estimated: 31 mL/min — ABNORMAL LOW (ref >60)
Glucose, Bld: 92 mg/dL (ref 70–99)
Potassium: 4.2 mmol/L (ref 3.5–5.1)
Sodium: 137 mmol/L (ref 135–145)

## 2023-12-06 LAB — GLUCOSE, CAPILLARY
Glucose-Capillary: 137 mg/dL — ABNORMAL HIGH (ref 70–99)
Glucose-Capillary: 143 mg/dL — ABNORMAL HIGH (ref 70–99)
Glucose-Capillary: 166 mg/dL — ABNORMAL HIGH (ref 70–99)
Glucose-Capillary: 74 mg/dL (ref 70–99)
Glucose-Capillary: 88 mg/dL (ref 70–99)
Glucose-Capillary: 93 mg/dL (ref 70–99)

## 2023-12-06 MED ORDER — SODIUM CHLORIDE 0.9 % IV SOLN
1.0000 g | INTRAVENOUS | Status: DC
  Administered 2023-12-06 – 2023-12-09 (×4): 1 g via INTRAVENOUS
  Filled 2023-12-06 (×4): qty 10

## 2023-12-06 NOTE — Plan of Care (Signed)
   Problem: Education: Goal: Knowledge of General Education information will improve Description: Including pain rating scale, medication(s)/side effects and non-pharmacologic comfort measures Outcome: Progressing   Problem: Clinical Measurements: Goal: Ability to maintain clinical measurements within normal limits will improve Outcome: Progressing   Problem: Activity: Goal: Risk for activity intolerance will decrease Outcome: Progressing   Problem: Nutrition: Goal: Adequate nutrition will be maintained Outcome: Progressing   Problem: Coping: Goal: Level of anxiety will decrease Outcome: Progressing   Problem: Elimination: Goal: Will not experience complications related to bowel motility Outcome: Progressing Goal: Will not experience complications related to urinary retention Outcome: Progressing

## 2023-12-06 NOTE — Progress Notes (Signed)
 Progress Note   Patient: Whitney Miller FMW:[id number] DOB: 09-09-1940 DOA: 12/04/2023     1 DOS: the patient was seen and examined on 12/06/2023   Brief hospital course: The patient is a 83 yr old woman who had an unwitnessed fall at home while getting out of bed. She reported right hip and left knee pain. She was hypotensive in the ED.   In the ED she was found to have had a leukocytosis of 12.4 and a creatinine of 2.72. She has a UTI.  CT head and pelvis were negative for acute pathology.  She met 3/4 sirs criteria. She received a bolus of IV fluid in the ED.  She has been admitted to a telemetry bed. She is receiving IV Rocephin .   Assessment and Plan: Acute kidney injury-secondary to hypotension -Patient presented emergency department secondary to for mechanical fall after she has a bowel movement and when she came to bed and stood up.  Patient denies any loss of consciousness and head injury.  At presentation to ED patient found to be hypotensive. This has responded to IV fluids.  - CT head, x-ray knee and x-ray sacrum no evidence of acute traumatic injury. - Further workup revealed elevated creatinine 2.72.  Concern for acute kidney injury in the setting of lisinopril and hydrochlorothiazide which is causing hypotension. They have been held. - UA is positive for UTI She has been started on Rocephin . - Holding blood pressure regimen - In the ED patient received 1.5 L of NS bolus.  Continue maintenance fluid LR 75 cc/h. -Continue to monitor improvement of renal function, avoid nephrotoxic agent and monitor urine output.  Sepsis -  The patient was tachypneic, hypotensive and had an elevated white blood cell count upon admission. Her source of infection is her UTI. She has been started on Rocephin . The sepsis is improved.      Mechanical fall secondary to hypotension -Mechanical fall secondary to hypotension.  Holding blood pressure regimen. - Consulted inpatient PT and OT for  further evaluation.   History of thyroid  cancer status post thyroidectomy Acquired hypothyroidism - Recently diagnosed with thyroid  cancer status post thyroidectomy and tracheostomy dependent. - Continue oral levothyroxine  and calcium  supplement per G-tube. -Patient was recently discharged from [facility] need to follow-up with outpatient oncologist and otolaryngologist.   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  Continue 6 L oxygen with tracheostomy-mask ventilation.  Continue supportive care.  Continue aspiration precaution.     Chronic G-tube dependent -Continue G-tube feeding.   Chronic tracheostomy dependent -Continue tracheostomy care.  Consulted wound care for further management.   Generalized anxiety disorder -Continue Celexa    DVT prophylaxis:  SQ Heparin  Code Status:  Full Code Diet: Completely NPO.  Continue G-tube feeding Disposition Plan: Continue monitor improvement of renal function Consults: Wound care, PT and OT Admission status:   Inpatient, Telemetry bed       Subjective: The patient is resting comfortably. No new complaints.   Physical Exam: Vitals:   12/05/23 2120 12/05/23 2327 12/06/23 0415 12/06/23 0503  BP:    124/64  Pulse: 71 80 74 (!) 53  Resp: 20 20 19 15   Temp:    98.6 F (37 C)  TempSrc:    Oral  SpO2: 99% 100% 99% 100%  Weight:      Height:       Exam:  Constitutional:  The patient is awake, alert, and oriented x 3. No acute distress.  ENMT:  grossly normal  hearing  Lips appear normal external ears, nose appear normal Oropharynx: mucosa, tongue,posterior pharynx appear normal  Neck:  neck appears normal, no masses, normal ROM, supple Trach collar in place. Appears clean and dry. Respiratory:  No increased work of breathing. No wheezes, rales, or rhonchi No tactile fremitus Cardiovascular:  Regular rate and rhythm No murmurs, ectopy, or gallups. No lateral PMI. No thrills. Abdomen:   Abdomen is soft, non-tender, non-distended No hernias, masses, or organomegaly Normoactive bowel sounds.  Musculoskeletal:  No cyanosis, clubbing, or edema Skin:  No rashes, lesions, ulcers palpation of skin: no induration or nodules Neurologic:  CN 2-12 intact Sensation all 4 extremities intact Psychiatric:  Mental status Mood, affect appropriate Orientation to person, place, time  judgment and insight appear intact  Data Reviewed:  Cbc, UA, BMP  Family Communication: Family at bedside.  Disposition: Status is: Inpatient Remains inpatient appropriate because: Sepsis, complex patient requiring tube feeds.  Planned Discharge Destination: Home  Time spent: 38 minutes  Author: Marilene Vath, DO 12/05/2023 6:24 PM  For on call review [email or web address].

## 2023-12-06 NOTE — Progress Notes (Signed)
 Mobility Specialist: Progress Note   12/06/23 1500  Mobility  Activity Ambulated with assistance  Level of Assistance Standby assist, set-up cues, supervision of patient - no hands on  Assistive Device Front wheel walker  Distance Ambulated (ft) 200 ft  Activity Response Tolerated well  Mobility Referral Yes  Mobility visit 1 Mobility  Mobility Specialist Start Time (ACUTE ONLY) 1105  Mobility Specialist Stop Time (ACUTE ONLY) 1124  Mobility Specialist Time Calculation (min) (ACUTE ONLY) 19 min    Pt received in bed, pleasant and agreeable to mobility session. SV for bed mobility. Light minA for STS. SV for ambulation. No complaints. Returned to room. Left in chair with all needs met, call bell in reach.   Ileana Lute Mobility Specialist Please contact via [employer] or Rehab office at [phone]

## 2023-12-06 NOTE — Progress Notes (Signed)
 Progress Note   Patient: Whitney Miller FMW:[id number] DOB: 04/26/1940 DOA: 12/04/2023     1 DOS: the patient was seen and examined on 12/06/2023   Brief hospital course: The patient is an 83 year old female with history of breast cancer and history of radiation therapy.  Patient had an unwitnessed fall at home while getting out of bed, reported orthostatic.  On presentation to the hospital, patient was hypotensive.  Patient also endorsed right hip and left hip pain.  Leukocytosis of 12.4 and serum creatinine of 2.72 were noted.  UA was suggestive of possible UTI.  However, urine and blood cultures have not been visualized.  CT head and pelvis were negative for acute pathology.  Patient is currently on IV Rocephin .  12/06/2023: Patient was seen alongside patient's nurse.  Patient's granddaughter was updated extensively.  Patient's granddaughter has requested a chest x-ray.  Productive cough was also reported.  Will proceed with urine, blood and sputum cultures.  The cultures may not grow any organisms as patient has been on antibiotics.  Assessment and Plan: Acute kidney injury: -Last A serum creatinine of 0.9. - Serum creatinine of 2.72 on presentation. - Serum creatinine has improved to 1.64. - AKI is likely multifactorial. - Patient was noted to be hypotensive/orthostatic. - Cannot rule out UTI. - Avoid nephrotoxins. - Optimize blood pressure. - Keep MAP greater than 65 mmHg. - Continue to monitor renal function and electrolytes.  Sepsis -  -Sepsis physiology has resolved. - Follow cultures. - Continue antibiotics.     Mechanical fall secondary to hypotension - PT OT eval. - Optimize blood pressure control. - Orthostatic vital signs.  History of thyroid  cancer status post thyroidectomy Acquired hypothyroidism - Recently diagnosed with thyroid  cancer status post thyroidectomy and tracheostomy dependent. - Continue oral levothyroxine  and calcium  supplement per  G-tube. -Patient was recently discharged from [facility] need to follow-up with outpatient oncologist and otolaryngologist.   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  Continue 6 L oxygen with tracheostomy-mask ventilation.  Continue supportive care.  Continue aspiration precaution.   Chronic G-tube dependent -Continue G-tube feeding.   Chronic tracheostomy dependent -Continue tracheostomy care.  Consulted wound care for further management.   Generalized anxiety disorder -Continue Celexa    DVT prophylaxis:  SQ Heparin  Code Status:  Full Code Disposition Plan: Continue monitor improvement of renal function Consults: Wound care, PT and OT Admission status:   Inpatient, Telemetry bed       Subjective: The patient is resting comfortably. No new complaints.   Physical Exam: Vitals:   12/06/23 0415 12/06/23 0503 12/06/23 0755 12/06/23 1321  BP:  124/64 100/72 107/66  Pulse: 74 (!) 53 69 62  Resp: 19 15    Temp:  98.6 F (37 C) 98.4 F (36.9 C) 98 F (36.7 C)  TempSrc:  Oral Oral   SpO2: 99% 100% 100% 100%  Weight:      Height:       Exam:  Constitutional:  The patient is awake, alert, and oriented x 3. No acute distress.  HEENT:  Patient is pale.    Neck:  neck is supple. Respiratory:  Clear to auscultation.   Cardiovascular:  S1-S2. Abdomen:  Abdomen is soft, non-tender.  Neurologic:  Awake and alert.  act  Data Reviewed:  Cbc, UA, BMP  Family Communication: Granddaughter.  Disposition: Status is: Inpatient Remains inpatient appropriate because: Sepsis, complex patient requiring tube feeds.  Planned Discharge Destination: Home  Time spent: 55 minutes  Author:  Leatrice LILLETTE Chapel, MD 12/05/2023 6:24 PM  For on call review [email or web address].

## 2023-12-07 DIAGNOSIS — N179 Acute kidney failure, unspecified: Secondary | ICD-10-CM | POA: Diagnosis not present

## 2023-12-07 LAB — RENAL FUNCTION PANEL
Albumin: 2.6 g/dL — ABNORMAL LOW (ref 3.5–5.0)
Anion gap: 10 (ref 5–15)
BUN: 58 mg/dL — ABNORMAL HIGH (ref 8–23)
CO2: 27 mmol/L (ref 22–32)
Calcium: 7.7 mg/dL — ABNORMAL LOW (ref 8.9–10.3)
Chloride: 99 mmol/L (ref 98–111)
Creatinine, Ser: 1.42 mg/dL — ABNORMAL HIGH (ref 0.44–1.00)
GFR, Estimated: 37 mL/min — ABNORMAL LOW (ref >60)
Glucose, Bld: 88 mg/dL (ref 70–99)
Phosphorus: 4 mg/dL (ref 2.5–4.6)
Potassium: 4.3 mmol/L (ref 3.5–5.1)
Sodium: 136 mmol/L (ref 135–145)

## 2023-12-07 LAB — CBC WITH DIFFERENTIAL/PLATELET
Abs Immature Granulocytes: 0.02 K/uL (ref 0.00–0.07)
Basophils Absolute: 0 K/uL (ref 0.0–0.1)
Basophils Relative: 0 %
Eosinophils Absolute: 0.1 K/uL (ref 0.0–0.5)
Eosinophils Relative: 1 %
HCT: 30.4 % — ABNORMAL LOW (ref 36.0–46.0)
Hemoglobin: 9.8 g/dL — ABNORMAL LOW (ref 12.0–15.0)
Immature Granulocytes: 0 %
Lymphocytes Relative: 15 %
Lymphs Abs: 1.4 K/uL (ref 0.7–4.0)
MCH: 28.6 pg (ref 26.0–34.0)
MCHC: 32.2 g/dL (ref 30.0–36.0)
MCV: 88.6 fL (ref 80.0–100.0)
Monocytes Absolute: 0.8 K/uL (ref 0.1–1.0)
Monocytes Relative: 9 %
Neutro Abs: 6.9 K/uL (ref 1.7–7.7)
Neutrophils Relative %: 75 %
Platelets: 227 K/uL (ref 150–400)
RBC: 3.43 MIL/uL — ABNORMAL LOW (ref 3.87–5.11)
RDW: 13.2 % (ref 11.5–15.5)
WBC: 9.2 K/uL (ref 4.0–10.5)
nRBC: 0 % (ref 0.0–0.2)

## 2023-12-07 LAB — GLUCOSE, CAPILLARY
Glucose-Capillary: 108 mg/dL — ABNORMAL HIGH (ref 70–99)
Glucose-Capillary: 111 mg/dL — ABNORMAL HIGH (ref 70–99)
Glucose-Capillary: 127 mg/dL — ABNORMAL HIGH (ref 70–99)
Glucose-Capillary: 85 mg/dL (ref 70–99)
Glucose-Capillary: 94 mg/dL (ref 70–99)
Glucose-Capillary: 99 mg/dL (ref 70–99)

## 2023-12-07 LAB — MAGNESIUM: Magnesium: 1.8 mg/dL (ref 1.7–2.4)

## 2023-12-07 NOTE — Progress Notes (Signed)
 Mobility Specialist Progress Note:    12/07/23 0936  Mobility  Activity Ambulated with assistance (In hallway)  Level of Assistance Standby assist, set-up cues, supervision of patient - no hands on  Assistive Device Front wheel walker  Distance Ambulated (ft) 160 ft  Activity Response Tolerated well  Mobility Referral Yes  Mobility visit 1 Mobility  Mobility Specialist Start Time (ACUTE ONLY) 0912  Mobility Specialist Stop Time (ACUTE ONLY) 0936  Mobility Specialist Time Calculation (min) (ACUTE ONLY) 24 min   Received pt in bed having no complaints and agreeable to mobility. Pt requested to use the BR prior to ambulating in hallway. Pt was asymptomatic throughout ambulation and returned to room w/o fault. Left in bed w/ call bell in reach and all needs met.   Lavanda Pollack Mobility Specialist  Please contact via [employer] or  Rehab Office [phone]

## 2023-12-07 NOTE — Progress Notes (Signed)
 Progress Note   Patient: Whitney Miller FMW:[id number] DOB: 12-16-1940 DOA: 12/04/2023     2 DOS: the patient was seen and examined on 12/07/2023   Brief hospital course: The patient is an 83 year old female with history of breast cancer and history of radiation therapy.  Patient had an unwitnessed fall at home while getting out of bed, reported orthostatic.  On presentation to the hospital, patient was hypotensive.  Patient also endorsed right hip and left hip pain.  Leukocytosis of 12.4 and serum creatinine of 2.72 were noted.  UA was suggestive of possible UTI.  However, urine and blood cultures have not been visualized.  CT head and pelvis were negative for acute pathology.  Patient is currently on IV Rocephin .  12/06/2023: Patient was seen alongside patient's nurse.  Patient's granddaughter was updated extensively.  Patient's granddaughter has requested a chest x-ray.  Productive cough was also reported.  Will proceed with urine, blood and sputum cultures.  The cultures may not grow any organisms as patient has been on antibiotics.  12/07/2023: Patient seen.  Patient reports that she is improving.  Cultures are still pending.  Continue IV Rocephin .  Assessment and Plan: Acute kidney injury: -Last A serum creatinine of 0.9. - Serum creatinine of 2.72 on presentation. - Serum creatinine has improved to 1.64. - AKI is likely multifactorial. - Patient was noted to be hypotensive/orthostatic. - Cannot rule out UTI. - Avoid nephrotoxins. - Optimize blood pressure. - Keep MAP greater than 65 mmHg. - Continue to monitor renal function and electrolytes.  Sepsis -  -Sepsis physiology has resolved. - Follow cultures. - Continue antibiotics.     Mechanical fall secondary to hypotension - PT OT eval. - Optimize blood pressure control. - Orthostatic vital signs.  History of thyroid  cancer status post thyroidectomy Acquired hypothyroidism - Recently diagnosed with thyroid  cancer status  post thyroidectomy and tracheostomy dependent. - Continue oral levothyroxine  and calcium  supplement per G-tube. -Patient was recently discharged from [facility] need to follow-up with outpatient oncologist and otolaryngologist.   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  Continue 6 L oxygen with tracheostomy-mask ventilation.  Continue supportive care.  Continue aspiration precaution.   Chronic G-tube dependent -Continue G-tube feeding.   Chronic tracheostomy dependent -Continue tracheostomy care.  Consulted wound care for further management.   Generalized anxiety disorder -Continue Celexa    DVT prophylaxis:  SQ Heparin  Code Status:  Full Code Disposition Plan: Continue monitor improvement of renal function Consults: Wound care, PT and OT Admission status:   Inpatient, Telemetry bed       Subjective: The patient is resting comfortably. No new complaints.   Physical Exam: Vitals:   12/07/23 0553 12/07/23 0730 12/07/23 0813 12/07/23 0817  BP: 125/67   106/61  Pulse: (!) 59 68  63  Resp: 17 18  16   Temp: 99.1 F (37.3 C)   98.4 F (36.9 C)  TempSrc: Oral     SpO2: 97% 98% 99% 100%  Weight:      Height:       Exam:  Constitutional:  The patient is awake, alert, and oriented x 3. No acute distress.  HEENT:  Patient is pale.    Neck:  neck is supple. Respiratory:  Clear to auscultation.   Cardiovascular:  S1-S2. Abdomen:  Abdomen is soft, non-tender.  Neurologic:  Awake and alert.  act  Data Reviewed:  Cbc, UA, BMP  Family Communication: Granddaughter.  Disposition: Status is: Inpatient Remains inpatient appropriate because: Sepsis,  complex patient requiring tube feeds.  Planned Discharge Destination: Home  Time spent: 35 minutes  Author: Leatrice LILLETTE Chapel, MD 12/05/2023 6:24 PM  For on call review [email or web address].

## 2023-12-07 NOTE — Plan of Care (Signed)
  Problem: Clinical Measurements: Goal: Diagnostic test results will improve Outcome: Progressing Goal: Cardiovascular complication will be avoided Outcome: Progressing   Problem: Activity: Goal: Risk for activity intolerance will decrease Outcome: Progressing   Problem: Nutrition: Goal: Adequate nutrition will be maintained Outcome: Progressing

## 2023-12-07 NOTE — Plan of Care (Signed)
   Problem: Education: Goal: Knowledge of General Education information will improve Description Including pain rating scale, medication(s)/side effects and non-pharmacologic comfort measures Outcome: Progressing

## 2023-12-07 NOTE — Progress Notes (Signed)
 New order placed for urine culture after lab called to state label was not attached to the sample, but in the bag.    RT Vicenta to collect sputum sample.

## 2023-12-07 NOTE — Progress Notes (Signed)
   12/07/23 0328  Therapy Vitals  Pulse Rate 81  Resp 16  Patient Position (if appropriate) Lying  MEWS Score/Color  MEWS Score 0  MEWS Score Color Green  Respiratory Assessment  Assessment Type Assess only  Respiratory Pattern Regular;Unlabored  Chest Assessment Chest expansion symmetrical  Cough Strong;Non-productive  Bilateral Breath Sounds Clear;Diminished  Oxygen Therapy/Pulse Ox  O2 Device Tracheostomy Collar  SpO2 100 %  O2 Therapy Oxygen humidified  O2 Flow Rate (L/min) 6 L/min  FiO2 (%) 28 %  Tracheostomy 4 mm Uncuffed  No placement date or time found.   Size (mm): 4 mm  Style: Uncuffed  Status Secured with trach ties  Site Assessment Clean;Dry  Site Care Cleansed;Dried  Inner Cannula Care Changed/new  Ties Assessment Clean, Dry  Trach Changed (S)  Yes (Changed by patient caregiver. Pt tolerated well.)  Tracheostomy Equipment at bedside Yes and checklist posted at head of bed

## 2023-12-08 DIAGNOSIS — N179 Acute kidney failure, unspecified: Secondary | ICD-10-CM | POA: Diagnosis not present

## 2023-12-08 LAB — URINE CULTURE: Culture: 30000 — AB

## 2023-12-08 LAB — GLUCOSE, CAPILLARY
Glucose-Capillary: 79 mg/dL (ref 70–99)
Glucose-Capillary: 181 mg/dL — ABNORMAL HIGH (ref 70–99)
Glucose-Capillary: 102 mg/dL — ABNORMAL HIGH (ref 70–99)
Glucose-Capillary: 96 mg/dL (ref 70–99)
Glucose-Capillary: 85 mg/dL (ref 70–99)

## 2023-12-08 NOTE — Progress Notes (Signed)
 Mobility Specialist Progress Note:   12/08/23 1105  Mobility  Activity Ambulated with assistance (In hallway)  Level of Assistance Standby assist, set-up cues, supervision of patient - no hands on  Assistive Device Front wheel walker  Distance Ambulated (ft) 100 ft  Activity Response Tolerated well  Mobility Referral Yes  Mobility visit 1 Mobility  Mobility Specialist Start Time (ACUTE ONLY) 1050  Mobility Specialist Stop Time (ACUTE ONLY) 1105  Mobility Specialist Time Calculation (min) (ACUTE ONLY) 15 min   Received pt in bed having no complaints and agreeable to mobility. Pt was asymptomatic throughout ambulation and returned to room w/o fault. Left in bed w/ call bell in reach and all needs met.  Lavanda Pollack Mobility Specialist  Please contact via [employer] or  Rehab Office [phone]

## 2023-12-08 NOTE — Progress Notes (Signed)
 Progress Note   Patient: Whitney Miller FMW:[id number] DOB: 01-Nov-1940 DOA: 12/04/2023     3 DOS: the patient was seen and examined on 12/08/2023   Brief hospital course: The patient is an 83 year old female with history of breast cancer and history of radiation therapy.  Patient had an unwitnessed fall at home while getting out of bed, reported orthostatic.  On presentation to the hospital, patient was hypotensive.  Patient also endorsed right hip and left hip pain.  Leukocytosis of 12.4 and serum creatinine of 2.72 were noted.  UA was suggestive of possible UTI.  However, urine and blood cultures have not been visualized.  CT head and pelvis were negative for acute pathology.  Patient is currently on IV Rocephin .  12/06/2023: Patient was seen alongside patient's nurse.  Patient's granddaughter was updated extensively.  Patient's granddaughter has requested a chest x-ray.  Productive cough was also reported.  Will proceed with urine, blood and sputum cultures.  The cultures may not grow any organisms as patient has been on antibiotics.  12/08/2023: Patient seen.  Patient continues to improve.  No new complaints.  Urine culture grew rare diphtheroids (Corynebacterium).  Likely discharge back on tomorrow.  Continue IV Rocephin .  Assessment and Plan: Acute kidney injury: -Last A serum creatinine of 0.9. - Serum creatinine of 2.72 on presentation. - Serum creatinine has improved to 1.64. - AKI is likely multifactorial. - Patient was noted to be hypotensive/orthostatic. - Cannot rule out UTI. - Avoid nephrotoxins. - Optimize blood pressure. - Keep MAP greater than 65 mmHg. - Continue to monitor renal function and electrolytes.  Sepsis -  -Sepsis physiology has resolved. - Follow cultures. - Continue antibiotics.     Mechanical fall secondary to hypotension - PT OT eval. - Optimize blood pressure control. - Orthostatic vital signs.  History of thyroid  cancer status post  thyroidectomy Acquired hypothyroidism - Recently diagnosed with thyroid  cancer status post thyroidectomy and tracheostomy dependent. - Continue oral levothyroxine  and calcium  supplement per G-tube. -Patient was recently discharged from [facility] need to follow-up with outpatient oncologist and otolaryngologist.   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  Continue 6 L oxygen with tracheostomy-mask ventilation.  Continue supportive care.  Continue aspiration precaution.   Chronic G-tube dependent -Continue G-tube feeding.   Chronic tracheostomy dependent -Continue tracheostomy care.  Consulted wound care for further management.   Generalized anxiety disorder -Continue Celexa    DVT prophylaxis:  SQ Heparin  Code Status:  Full Code Disposition Plan: Continue monitor improvement of renal function Consults: Wound care, PT and OT Admission status:   Inpatient, Telemetry bed       Subjective: The patient is resting comfortably. No new complaints.   Physical Exam: Vitals:   12/08/23 0550 12/08/23 0744 12/08/23 0835 12/08/23 1538  BP: 115/68 101/61  99/62  Pulse: 62 72 77 71  Resp: 15 16 16 16   Temp: 99.3 F (37.4 C) 99 F (37.2 C)  98.2 F (36.8 C)  TempSrc: Oral     SpO2: 100% 100% 98% 100%  Weight:      Height:       Exam:  Constitutional:  The patient is awake, alert, and oriented x 3. No acute distress.  HEENT:  Patient is pale.    Neck:  neck is supple. Respiratory:  Clear to auscultation.   Cardiovascular:  S1-S2. Abdomen:  Abdomen is soft, non-tender.  Neurologic:  Awake and alert.  act  Data Reviewed:  Cbc, UA, BMP  Family  Communication: Granddaughter.  Disposition: Status is: Inpatient Remains inpatient appropriate because: Sepsis, complex patient requiring tube feeds.  Planned Discharge Destination: Home  Time spent: 35 minutes  Author: Leatrice LILLETTE Chapel, MD 12/05/2023 6:24 PM  For on call review  [email or web address].

## 2023-12-09 DIAGNOSIS — N179 Acute kidney failure, unspecified: Secondary | ICD-10-CM | POA: Diagnosis not present

## 2023-12-09 LAB — GLUCOSE, CAPILLARY
Glucose-Capillary: 75 mg/dL (ref 70–99)
Glucose-Capillary: 80 mg/dL (ref 70–99)
Glucose-Capillary: 81 mg/dL (ref 70–99)
Glucose-Capillary: 83 mg/dL (ref 70–99)
Glucose-Capillary: 93 mg/dL (ref 70–99)

## 2023-12-09 MED ORDER — PROSOURCE TF20 ENFIT COMPATIBL EN LIQD: 60.0000 mL | Freq: Every day | ENTERAL | 0 refills | Status: AC

## 2023-12-09 MED ORDER — CEFDINIR 300 MG PO CAPS: 300.0000 mg | ORAL_CAPSULE | Freq: Every day | ORAL | 0 refills | Status: AC

## 2023-12-09 NOTE — TOC Initial Note (Signed)
 Transition of Care [facility]) - Initial/Assessment Note    Patient Details  Name: Whitney Miller MRN: [id number] Date of Birth: August 12, 1940  Transition of Care [facility]) CM/SW Contact:    Bridget Cordella Simmonds, LCSW Phone Number: 12/09/2023, 4:16 PM  Clinical Narrative:    CSW met with pt for initial assessment.  Pt from home with sig other, Lynwood, who does assist.  Pt granddaughter Geni is also a engineer, site who helps regularly as well.  Permission given to speak with them both.  Pt reports she has HH speech therapy but does not recall which agency.  Pt is agreeable to [facility] PT as well.  Current DME in the home: walker, 3n1, suction machine.  Pt reports she does not need additional equipment.  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                 Expected Discharge Plan: Home w Home Health Services Barriers to Discharge: No Barriers Identified   Patient Goals and CMS Choice            Expected Discharge Plan and Services     Post Acute Care Choice: Home Health Living arrangements for the past 2 months: Single Family Home Expected Discharge Date: 12/09/23                                    Prior Living Arrangements/Services Living arrangements for the past 2 months: Single Family Home Lives with:: Significant Other Patient language and need for interpreter reviewed:: Yes Do you feel safe going back to the place where you live?: Yes      Need for Family Participation in Patient Care: Yes (Comment) Care giver support system in place?: Yes (comment) Current home services: Other (comment) (HH speech therapy) Criminal Activity/Legal Involvement Pertinent to Current Situation/Hospitalization: No - Comment as needed  Activities of Daily Living   ADL Screening (condition at time of admission) Independently performs ADLs?: Yes (appropriate for developmental age) Is the patient deaf or have difficulty hearing?: No Does the  patient have difficulty seeing, even when wearing glasses/contacts?: No Does the patient have difficulty concentrating, remembering, or making decisions?: No  Permission Sought/Granted Permission sought to share information with : Family Supports Permission granted to share information with : Yes, Verbal Permission Granted  Share Information with NAME: granddaughter Geni  Permission granted to share info w AGENCY: HH        Emotional Assessment Appearance:: Appears stated age Attitude/Demeanor/Rapport: Engaged Affect (typically observed): Appropriate, Pleasant Orientation: : Oriented to Self, Oriented to Place, Oriented to  Time, Oriented to Situation      Admission diagnosis:  AKI (acute kidney injury) [N17.9] Fall, initial encounter [W19.XXXA] Patient Active Problem List   Diagnosis Date Noted   Protein-calorie malnutrition, severe 12/05/2023   AKI (acute kidney injury) 12/04/2023   Fall at home, initial encounter 12/04/2023   History of thyroid  cancer 12/04/2023   Feeding by G-tube (HCC) 12/04/2023   Hypothyroidism 12/04/2023   Generalized anxiety disorder 12/04/2023   Personal history of breast cancer 07/03/2022   Scoliosis deformity of spine 10/09/2021   Pain in joint of right hip 08/16/2021   Osteoarthritis of right hip 08/08/2021   Spinal stenosis of lumbar region 08/08/2021   Recurrent major depression in partial remission 01/25/2021   Stage 3a chronic kidney disease (CKD) (HCC) 01/25/2021   Constipation  10/18/2020   Screening for malignant neoplasm of colon 10/18/2020   Abnormal mammogram 12/30/2019   Allergic rhinitis 12/30/2019   Anxiety 12/30/2019   Hyperlipidemia 12/30/2019   Essential hypertension 12/30/2019   Personal history of malignant neoplasm of breast 12/30/2019   Skin sensation disturbance 12/30/2019   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   Thyroid  nodule 12/30/2019   PCP:  Elliot Charm, MD Pharmacy:   [facility] [phone] - [place],  [place] - [address] AT [facility] [address] [place] [address] Phone: [phone] Fax: [phone]     Social Drivers of Health (SDOH) Social History: SDOH Screenings   Food Insecurity: No Food Insecurity (12/05/2023)  Housing: Low Risk  (12/05/2023)  Transportation Needs: No Transportation Needs (12/05/2023)  Utilities: Not At Risk (12/05/2023)  Social Connections: Socially Integrated (12/05/2023)  Tobacco Use: Low Risk  (12/05/2023)   SDOH Interventions:     Readmission Risk Interventions     No data to display

## 2023-12-09 NOTE — TOC Transition Note (Incomplete)
 Transition of Care [facility]) - Discharge Note   Patient Details  Name: Whitney Miller MRN: [id number] Date of Birth: 11-29-1940  Transition of Care [facility]) CM/SW Contact:  Rosalva Jon Bloch, RN Phone Number: 12/09/2023, 4:41 PM   Clinical Narrative:    Patient will DC to: home Anticipated DC date: 12/09/2023 Family notified: yes,  Transport by:car   Per MD patient ready for DC today . RN, patient, patient's family, and facility notified of DC. Discharge Summary and FL2 sent to facility. RN to call report prior to discharge (). DC packet on chart. Ambulance transport requested for patient.   RNCM will sign off for now as intervention is no longer needed. Please consult us  again if new needs arise.    Final next level of care: Home w Home Health Services Barriers to Discharge: No Barriers Identified   Patient Goals and CMS Choice            Discharge Placement                       Discharge Plan and Services Additional resources added to the After Visit Summary for       Post Acute Care Choice: Home Health                               Social Drivers of Health (SDOH) Interventions SDOH Screenings   Food Insecurity: No Food Insecurity (12/05/2023)  Housing: Low Risk  (12/05/2023)  Transportation Needs: No Transportation Needs (12/05/2023)  Utilities: Not At Risk (12/05/2023)  Social Connections: Socially Integrated (12/05/2023)  Tobacco Use: Low Risk  (12/05/2023)     Readmission Risk Interventions     No data to display

## 2023-12-09 NOTE — Discharge Summary (Signed)
 Physician Discharge Summary  Patient ID: Whitney Miller MRN: [id number] DOB/AGE: [birth year] 81 y.o.  Admit date: 12/04/2023 Discharge date: 12/09/2023  Admission Diagnoses:  Discharge Diagnoses:  Principal Problem:   AKI (acute kidney injury) Active Problems:   Hyperlipidemia   Essential hypertension   Fall at home, initial encounter   History of thyroid  cancer   Feeding by G-tube (HCC)   Hypothyroidism   Generalized anxiety disorder   Protein-calorie malnutrition, severe   Discharged Condition: {condition:18240}  Hospital Course: ***  Consults: {consultation:18241}  Significant Diagnostic Studies: {diagnostics:18242}  Treatments: {Tx:18249}  Discharge Exam: Blood pressure (!) 122/59, pulse 71, temperature 97.7 F (36.5 C), temperature source Oral, resp. rate 18, height 5' 3 (1.6 m), weight 63.5 kg, SpO2 100%. {physical zkjf:6958869}  Disposition: Discharge disposition: 06-Home-Health Care Svc       Discharge Instructions     Diet - low sodium heart healthy   Complete by: As directed    Increase activity slowly   Complete by: As directed       Allergies as of 12/09/2023       Reactions   Penicillins Hives   Tylenol [acetaminophen] Hives   Tetanus Toxoid-containing Vaccines Dermatitis        Medication List     STOP taking these medications    fluticasone 50 MCG/ACT nasal spray Commonly known as: FLONASE   lisinopril-hydrochlorothiazide 20-25 MG tablet Commonly known as: ZESTORETIC   metoprolol tartrate 100 MG tablet Commonly known as: LOPRESSOR       TAKE these medications    cefdinir  300 MG capsule Commonly known as: OMNICEF  Take 1 capsule (300 mg total) by mouth daily for 5 days.   citalopram  20 MG tablet Commonly known as: CELEXA  Place 20 mg into feeding tube daily.   feeding supplement (PROSource TF20) liquid Place 60 mLs into feeding tube daily. Start taking on: December 10, 2023   free water  Soln Place 120 mLs  into feeding tube every 8 (eight) hours.   levothyroxine  150 MCG tablet Commonly known as: SYNTHROID  Place 150 mcg into feeding tube daily before breakfast.   Nutren 2.0 Liqd Place 250 mLs into feeding tube in the morning, at noon, and at bedtime.   simvastatin  40 MG tablet Commonly known as: ZOCOR  Place 40 mg into feeding tube at bedtime.   Tums 500 MG chewable tablet Generic drug: calcium  carbonate Place 1 tablet into feeding tube in the morning.        Follow-up Information     Elliot Charm, MD Follow up.   Specialty: Internal Medicine Contact information: [address] [place] [address] [phone]                 Signed: Leatrice LILLETTE Chapel 12/09/2023, 3:07 PM

## 2023-12-09 NOTE — Progress Notes (Signed)
 Occupational Therapy Treatment Patient Details Name: Whitney Miller MRN: [id number] DOB: Aug 09, 1940 Today's Date: 12/09/2023   History of present illness Pt is an 83 year old woman admitted on 12/04/23 after a fall. + hypotension and acute kidney injury. Sustained forehead abrasion, buttocks and R knee pain, all imaging negative for fx. PMH: thyroid  ca s/p thyroidectomy 10/04/23, tracheostomy, g-tube, GAD, HTN, HLD.   OT comments  Pt up inchair. Report her buttocks pain is better. Declined bathing and dressing, prefers to wait until IV med is finished. Declined walking in hall without her shoes. Transferred and and from [facility], managed pericare with supervision for safety and lines. Completed seated grooming with set up. VSS on RA with P[facility] donned.       If plan is discharge home, recommend the following:  Assistance with cooking/housework;Assist for transportation;Help with stairs or ramp for entrance;Direct supervision/assist for medications management   Equipment Recommendations  None recommended by OT    Recommendations for Other Services      Precautions / Restrictions Precautions Precautions: Fall Recall of Precautions/Restrictions: Intact Restrictions Weight Bearing Restrictions Per Provider Order: No       Mobility Bed Mobility               General bed mobility comments: in chair    Transfers                         Balance     Sitting balance-Leahy Scale: Good       Standing balance-Leahy Scale: Fair                             ADL either performed or assessed with clinical judgement   ADL Overall ADL's : Needs assistance/impaired     Grooming: Wash/dry hands;Sitting;Set up Grooming Details (indicate cue type and reason): put pt's hair up in clip                 Toilet Transfer: Supervision/safety;Stand-pivot;BSC/3in1;Rolling walker (2 wheels)   Toileting- Clothing Manipulation and Hygiene: Supervision/safety;Sit  to/from stand              Extremity/Trunk Assessment              Vision       Perception     Praxis     Communication Communication Communication: Other (comment) Factors Affecting Communication: Passey - Muir valve   Cognition Arousal: Alert Behavior During Therapy: WFL for tasks assessed/performed Cognition: No apparent impairments                               Following commands: Intact        Cueing   Cueing Techniques: Verbal cues  Exercises      Shoulder Instructions       General Comments declined walking in hall without her shoes    Pertinent Vitals/ Pain       Pain Assessment Pain Assessment: Faces Faces Pain Scale: Hurts a little bit Pain Location: buttocks Pain Descriptors / Indicators: Sore Pain Intervention(s): Monitored during session, Repositioned  Home Living                                          Prior Functioning/Environment  Frequency  Min 1X/week        Progress Toward Goals  OT Goals(current goals can now be found in the care plan section)  Progress towards OT goals: Progressing toward goals  Acute Rehab OT Goals OT Goal Formulation: With patient Time For Goal Achievement: 12/19/23 Potential to Achieve Goals: Good  Plan      Co-evaluation                 AM-PAC OT 6 Clicks Daily Activity     Outcome Measure   Help from another person eating meals?: None Help from another person taking care of personal grooming?: A Little Help from another person toileting, which includes using toliet, bedpan, or urinal?: A Little Help from another person bathing (including washing, rinsing, drying)?: A Little Help from another person to put on and taking off regular upper body clothing?: A Little Help from another person to put on and taking off regular lower body clothing?: A Little 6 Click Score: 19    End of Session Equipment Utilized During Treatment: Rolling  walker (2 wheels);Gait belt  OT Visit Diagnosis: Unsteadiness on feet (R26.81);History of falling (Z91.81)   Activity Tolerance Patient tolerated treatment well   Patient Left in chair;with call bell/phone within reach;with nursing/sitter in room   Nurse Communication          Time: 8873-8858 OT Time Calculation (min): 15 min  Charges: OT General Charges $OT Visit: 1 Visit OT Treatments $Self Care/Home Management : 8-22 mins  Mliss HERO, OTR/L Acute Rehabilitation Services Office: [phone]   Kennth Mliss Helling 12/09/2023, 12:02 PM

## 2023-12-09 NOTE — Progress Notes (Signed)
 Mobility Specialist: Progress Note   12/09/23 1200  Mobility  Activity Pivoted/transferred from bed to chair  Level of Assistance Standby assist, set-up cues, supervision of patient - no hands on  Assistive Device Front wheel walker  Distance Ambulated (ft) 5 ft  Activity Response Tolerated well  Mobility Referral Yes  Mobility visit 1 Mobility  Mobility Specialist Start Time (ACUTE ONLY) 1020  Mobility Specialist Stop Time (ACUTE ONLY) 1029  Mobility Specialist Time Calculation (min) (ACUTE ONLY) 9 min    Pt received in bed. Declined ambulating at this time, stated that she doesn't have her shoes with her and walking is starting to hurt the bottom of her feet. Only agreeable to transfer to the chair. SV throughout for short bout of ambulation to the chair. Left in chair with all needs met, call bell in reach.   Whitney Miller Mobility Specialist Please contact via SecureChat or Rehab office at [phone]

## 2023-12-09 NOTE — Plan of Care (Signed)
   Problem: Education: Goal: Knowledge of General Education information will improve Description Including pain rating scale, medication(s)/side effects and non-pharmacologic comfort measures Outcome: Progressing   Problem: Health Behavior/Discharge Planning: Goal: Ability to manage health-related needs will improve Outcome: Progressing

## 2023-12-11 LAB — CULTURE, BLOOD (ROUTINE X 2)
Culture: NO GROWTH
Culture: NO GROWTH
